# Patient Record
Sex: Female | Born: 1995 | Race: White | Hispanic: No | Marital: Single | Smoking: Former smoker
Health system: Southern US, Community
[De-identification: ages and names within clinical notes are randomized; demographics above are authoritative.]

## PROBLEM LIST (undated history)

## (undated) ENCOUNTER — Inpatient Hospital Stay: Payer: Self-pay

## (undated) DIAGNOSIS — R1083 Colic: Secondary | ICD-10-CM

## (undated) DIAGNOSIS — N912 Amenorrhea, unspecified: Secondary | ICD-10-CM

## (undated) DIAGNOSIS — F1910 Other psychoactive substance abuse, uncomplicated: Secondary | ICD-10-CM

## (undated) DIAGNOSIS — F419 Anxiety disorder, unspecified: Secondary | ICD-10-CM

## (undated) DIAGNOSIS — K219 Gastro-esophageal reflux disease without esophagitis: Secondary | ICD-10-CM

## (undated) DIAGNOSIS — J45909 Unspecified asthma, uncomplicated: Secondary | ICD-10-CM

## (undated) DIAGNOSIS — F32A Depression, unspecified: Secondary | ICD-10-CM

## (undated) DIAGNOSIS — R109 Unspecified abdominal pain: Secondary | ICD-10-CM

## (undated) HISTORY — PX: CHOLECYSTECTOMY: SHX55

## (undated) HISTORY — DX: Colic: R10.83

## (undated) HISTORY — PX: RHINOPLASTY: SUR1284

## (undated) HISTORY — DX: Unspecified asthma, uncomplicated: J45.909

## (undated) HISTORY — PX: SEPTOPLASTY: SUR1290

## (undated) HISTORY — DX: Amenorrhea, unspecified: N91.2

## (undated) HISTORY — DX: Unspecified abdominal pain: R10.9

---
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
## 2013-02-24 ENCOUNTER — Encounter: Payer: Self-pay | Admitting: Obstetrics and Gynecology

## 2013-02-26 ENCOUNTER — Encounter: Payer: Self-pay | Admitting: Obstetrics and Gynecology

## 2013-06-25 ENCOUNTER — Observation Stay: Payer: Self-pay

## 2013-06-25 LAB — URINALYSIS, COMPLETE
Bilirubin,UR: NEGATIVE
Blood: NEGATIVE
GLUCOSE, UR: NEGATIVE mg/dL (ref 0–75)
Ketone: NEGATIVE
NITRITE: NEGATIVE
PROTEIN: NEGATIVE
Ph: 7 (ref 4.5–8.0)
Specific Gravity: 1.011 (ref 1.003–1.030)
WBC UR: 1 /HPF (ref 0–5)

## 2013-08-23 ENCOUNTER — Observation Stay: Payer: Self-pay

## 2013-08-29 ENCOUNTER — Inpatient Hospital Stay: Payer: Self-pay

## 2013-08-29 LAB — CBC WITH DIFFERENTIAL/PLATELET
BASOS PCT: 0.4 %
Basophil #: 0 10*3/uL (ref 0.0–0.1)
EOS PCT: 0.2 %
Eosinophil #: 0 10*3/uL (ref 0.0–0.7)
HCT: 36.6 % (ref 35.0–47.0)
HGB: 12.1 g/dL (ref 12.0–16.0)
Lymphocyte #: 0.7 10*3/uL — ABNORMAL LOW (ref 1.0–3.6)
Lymphocyte %: 7.1 %
MCH: 30.3 pg (ref 26.0–34.0)
MCHC: 33.1 g/dL (ref 32.0–36.0)
MCV: 92 fL (ref 80–100)
Monocyte #: 0.7 x10 3/mm (ref 0.2–0.9)
Monocyte %: 7 %
NEUTROS PCT: 85.3 %
Neutrophil #: 8.7 10*3/uL — ABNORMAL HIGH (ref 1.4–6.5)
PLATELETS: 233 10*3/uL (ref 150–440)
RBC: 3.99 10*6/uL (ref 3.80–5.20)
RDW: 13.2 % (ref 11.5–14.5)
WBC: 10.2 10*3/uL (ref 3.6–11.0)

## 2013-08-29 LAB — GC/CHLAMYDIA PROBE AMP

## 2013-08-31 LAB — HEMATOCRIT: HCT: 33 % — AB (ref 35.0–47.0)

## 2014-03-08 ENCOUNTER — Emergency Department: Payer: Self-pay | Admitting: Emergency Medicine

## 2014-07-02 ENCOUNTER — Emergency Department: Payer: Self-pay | Admitting: Emergency Medicine

## 2014-07-02 LAB — URINALYSIS, COMPLETE
BLOOD: NEGATIVE
Bacteria: NONE SEEN
Bilirubin,UR: NEGATIVE
GLUCOSE, UR: NEGATIVE mg/dL (ref 0–75)
KETONE: NEGATIVE
Leukocyte Esterase: NEGATIVE
NITRITE: NEGATIVE
PH: 7 (ref 4.5–8.0)
PROTEIN: NEGATIVE
RBC,UR: NONE SEEN /HPF (ref 0–5)
SPECIFIC GRAVITY: 1.011 (ref 1.003–1.030)
Squamous Epithelial: 3

## 2014-07-02 LAB — TROPONIN I

## 2014-07-02 LAB — COMPREHENSIVE METABOLIC PANEL
Albumin: 3.1 g/dL — ABNORMAL LOW (ref 3.4–5.0)
Alkaline Phosphatase: 69 U/L (ref 46–116)
Anion Gap: 5 — ABNORMAL LOW (ref 7–16)
BILIRUBIN TOTAL: 0.3 mg/dL (ref 0.2–1.0)
BUN: 4 mg/dL — AB (ref 7–18)
CHLORIDE: 108 mmol/L — AB (ref 98–107)
CREATININE: 0.71 mg/dL (ref 0.60–1.30)
Calcium, Total: 8.8 mg/dL (ref 8.5–10.1)
Co2: 29 mmol/L (ref 21–32)
EGFR (African American): >60
EGFR (Non-African Amer.): >60
Glucose: 72 mg/dL (ref 65–99)
Osmolality: 279 (ref 275–301)
POTASSIUM: 4.1 mmol/L (ref 3.5–5.1)
SGOT(AST): 22 U/L (ref 15–37)
SGPT (ALT): 16 U/L (ref 14–63)
Sodium: 142 mmol/L (ref 136–145)
TOTAL PROTEIN: 7.1 g/dL (ref 6.4–8.2)

## 2014-07-02 LAB — CBC WITH DIFFERENTIAL/PLATELET
BASOS ABS: 0 10*3/uL (ref 0.0–0.1)
Basophil %: 0.8 %
Eosinophil #: 0.1 10*3/uL (ref 0.0–0.7)
Eosinophil %: 2.5 %
HCT: 37.2 % (ref 35.0–47.0)
HGB: 12.1 g/dL (ref 12.0–16.0)
LYMPHS ABS: 0.9 10*3/uL — AB (ref 1.0–3.6)
Lymphocyte %: 16.2 %
MCH: 30.2 pg (ref 26.0–34.0)
MCHC: 32.6 g/dL (ref 32.0–36.0)
MCV: 93 fL (ref 80–100)
MONO ABS: 0.5 x10 3/mm (ref 0.2–0.9)
Monocyte %: 8.5 %
NEUTROS ABS: 4 10*3/uL (ref 1.4–6.5)
Neutrophil %: 72 %
PLATELETS: 350 10*3/uL (ref 150–440)
RBC: 4.02 10*6/uL (ref 3.80–5.20)
RDW: 13.5 % (ref 11.5–14.5)
WBC: 5.6 10*3/uL (ref 3.6–11.0)

## 2014-07-02 LAB — LIPASE, BLOOD: LIPASE: 82 U/L (ref 73–393)

## 2014-07-02 LAB — AMYLASE: Amylase: 47 U/L (ref 25–115)

## 2014-07-03 DIAGNOSIS — F419 Anxiety disorder, unspecified: Secondary | ICD-10-CM | POA: Insufficient documentation

## 2014-07-03 DIAGNOSIS — F329 Major depressive disorder, single episode, unspecified: Secondary | ICD-10-CM | POA: Insufficient documentation

## 2014-07-03 DIAGNOSIS — F32A Depression, unspecified: Secondary | ICD-10-CM | POA: Insufficient documentation

## 2014-07-07 ENCOUNTER — Ambulatory Visit: Payer: Self-pay | Admitting: Family Medicine

## 2014-08-26 ENCOUNTER — Ambulatory Visit: Admit: 2014-08-26 | Disposition: A | Discharge status: Home / Self Care | Payer: Self-pay | Admitting: Surgery

## 2014-08-26 LAB — COMPREHENSIVE METABOLIC PANEL
ALBUMIN: 3.9 g/dL
ALK PHOS: 43 U/L
ALT: 15 U/L
AST: 18 U/L
Anion Gap: 4 — ABNORMAL LOW (ref 7–16)
BUN: 12 mg/dL
Bilirubin,Total: 0.5 mg/dL
CALCIUM: 8.7 mg/dL — AB
CHLORIDE: 109 mmol/L
CO2: 25 mmol/L
Creatinine: 0.72 mg/dL
EGFR (African American): >60
EGFR (Non-African Amer.): >60
Glucose: 93 mg/dL
POTASSIUM: 3.9 mmol/L
Sodium: 138 mmol/L
Total Protein: 6.8 g/dL

## 2014-08-26 LAB — CBC WITH DIFFERENTIAL/PLATELET
BASOS ABS: 0 10*3/uL (ref 0.0–0.1)
BASOS PCT: 0.5 %
EOS ABS: 0.1 10*3/uL (ref 0.0–0.7)
EOS PCT: 1 %
HCT: 39.3 % (ref 35.0–47.0)
HGB: 13.1 g/dL (ref 12.0–16.0)
LYMPHS ABS: 1.1 10*3/uL (ref 1.0–3.6)
Lymphocyte %: 16.6 %
MCH: 29.9 pg (ref 26.0–34.0)
MCHC: 33.3 g/dL (ref 32.0–36.0)
MCV: 90 fL (ref 80–100)
MONO ABS: 0.5 x10 3/mm (ref 0.2–0.9)
Monocyte %: 7.1 %
Neutrophil #: 4.9 10*3/uL (ref 1.4–6.5)
Neutrophil %: 74.8 %
PLATELETS: 268 10*3/uL (ref 150–440)
RBC: 4.38 10*6/uL (ref 3.80–5.20)
RDW: 14.5 % (ref 11.5–14.5)
WBC: 6.6 10*3/uL (ref 3.6–11.0)

## 2014-09-01 ENCOUNTER — Ambulatory Visit
Admit: 2014-09-01 | Disposition: A | Discharge status: Home / Self Care | Payer: Self-pay | Attending: Surgery | Admitting: Surgery

## 2014-09-21 LAB — SURGICAL PATHOLOGY

## 2014-09-27 NOTE — Op Note (Signed)
PATIENT NAME:  Amanda Barnett, Amanda Barnett MR#:  [id number] DATE OF BIRTH:  1995/09/18  DATE OF PROCEDURE:  09/01/2014  PREOPERATIVE DIAGNOSIS: Chronic acalculous cholecystitis.   POSTOPERATIVE DIAGNOSIS: Chronic acalculous cholecystitis.   PROCEDURE: Laparoscopic cholecystectomy.   SURGEON: Renda RollsWilton Smith, MD  ANESTHESIA: General.   INDICATIONS: This 19 year old female has a 3 month history of intermittent right upper quadrant abdominal pains. She had an abnormally low gallbladder ejection fraction at 27% and surgery was recommended for definitive treatment.   DESCRIPTION OF PROCEDURE: The patient was placed on the operating table in the supine position under general endotracheal anesthesia. The abdomen was prepared with ChloraPrep and draped in a sterile manner.   A short incision was made in the inferior aspect of the umbilicus and carried down to the deep fascia which was grasped with laryngeal hook and elevated. A Veress needle was inserted, aspirated, and irrigated with a saline solution. Next, the peritoneal cavity was inflated with carbon dioxide. The Veress needle was removed. The 10 mm cannula was inserted. The 10 mm, 0 degree laparoscope was inserted to view the peritoneal cavity. Another incision was made in the epigastrium slightly to the right of the midline to introduce an 11 mm cannula. Two incisions were made in the lateral aspect of the right upper quadrant to introduce two 5 mm cannulas. Initial inspection revealed there was some mild distention of the stomach, and I had the anesthetist insert an orogastric tube for decompression of the stomach. The liver appeared normal. The gallbladder appeared typical. Visible intestines appeared typical. The gallbladder was retracted towards the right shoulder. The infundibulum was retracted inferiorly and laterally. The porta hepatis was noted. The gallbladder neck was mobilized with incision of the visceral peritoneum. The cystic artery was dissected  free from surrounding structures. The cystic duct was dissected. It appeared that the cystic artery was in the way of dissection of cystic duct and therefore the cystic artery was controlled with Endoclips and divided. This allowed better exposure of the cystic duct which was further dissected free from surrounding structures. An Endoclip was placed across the cystic duct adjacent to the neck of the gallbladder. An incision was made in the cystic duct to introduce a Reddick catheter; however, the catheter would only thread in approximately 6 mm. It would not thread far enough in to inflate the balloon and therefore cholangiogram was not done. The Reddick catheter was removed. The cystic duct was doubly ligated with Endoclips and divided. The gallbladder was dissected free from the liver with hook and cautery. Bleeding was very minimal. Hemostasis was subsequently intact. The gallbladder was delivered up through the infraumbilical incision, opened and suctioned, removed and submitted in formalin for routine pathology. The right upper quadrant was further inspected and could see hemostasis was intact. The cannulas were removed allowing carbon dioxide to escape from the peritoneal cavity. Skin incisions were closed with interrupted 5-0 chromic subcuticular suture, benzoin, and Steri-Strips. Dressings were applied with paper tape.   The patient appeared to tolerate the procedure satisfactorily and was then prepared for transfer to the recovery room.  ____________________________ Shela CommonsJ. Renda RollsWilton Smith, MD jws:sb D: 09/01/2014 08:57:22 ET T: 09/01/2014 09:14:19 ET JOB#: [id number]  cc: Adella HareJ. Wilton Smith, MD, <Dictator> Adella HareWILTON J SMITH MD ELECTRONICALLY SIGNED 09/02/2014 16:27

## 2014-10-06 NOTE — H&P (Signed)
L&D Evaluation:  History:  HPI 19yo SWF present in early labor at 2322w5d G1P0000. Reports labor since 3am worse from 10am on- denies decreased FM or LOF. Normal PNC except + CMZ in second trimester-TOC- & 36w labs negative.GBS+, h/o anxiety & depression- on zoloft.   Presents with contractions   Patient's Medical History anxiety/depression,   Patient's Surgical History none   Medications Pre Natal Vitamins  Tylenol (Acetaminophen)  zoloft, acyclovir, omeprazole   Allergies other, latex   Social History none   Family History Non-Contributory   ROS:  ROS All systems were reviewed.  HEENT, CNS, GI, GU, Respiratory, CV, Renal and Musculoskeletal systems were found to be normal.   Exam:  Vital Signs stable   General no apparent distress   Mental Status clear   Chest clear   Heart normal sinus rhythm   Abdomen gravid, tender with contractions   Estimated Fetal Weight Average for gestational age   Fetal Position vtx   Edema 1+   Mebranes Intact   FHT normal rate with no decels   Fetal Heart Rate 125   Ucx regular   Ucx Frequency 2 min   Length of each Contraction 60 seconds   Ucx Pain Scale 7   Skin dry   Impression:  Impression early labor   Plan:  Plan monitor contractions and for cervical change, antibiotics for GBBS prophylaxis   Electronic Signatures: Amanda Barnett, Amanda Barnett (CNM)  (Signed 03-Apr-15 15:39)  Authored: L&D Evaluation   Last Updated: 03-Apr-15 15:39 by Amanda Barnett, Amanda Barnett (CNM)

## 2015-01-01 ENCOUNTER — Encounter: Payer: Self-pay | Admitting: *Deleted

## 2015-01-05 NOTE — Discharge Instructions (Signed)
[facility] °[facility] °ENDOSCOPIC SINUS SURGERY ° [facility] ° °What is Functional Endoscopic Sinus Surgery? ° The Surgery involves making the natural openings of the sinuses larger by removing the bony partitions that separate the sinuses from the nasal cavity.  The natural sinus lining is preserved as much as possible to allow the sinuses to resume normal function after the surgery.  In some patients nasal polyps (excessively swollen lining of the sinuses) may be removed to relieve obstruction of the sinus openings.  The surgery is performed through the nose using lighted scopes, which eliminates the need for incisions on the face.  A septoplasty is a different procedure which is sometimes performed with sinus surgery.  It involves straightening the boy partition that separates the two sides of your nose.  A crooked or deviated septum may need repair if is obstructing the sinuses or nasal airflow.  Turbinate reduction is also often performed during sinus surgery.  The turbinates are bony proturberances from the side walls of the nose which swell and can obstruct the nose in patients with sinus and allergy problems.  Their size can be surgically reduced to help relieve nasal obstruction. ° °What Can Sinus Surgery Do For Me? ° Sinus surgery can reduce the frequency of sinus infections requiring antibiotic treatment.  This can provide improvement in nasal congestion, post-nasal drainage, facial pressure and nasal obstruction.  Surgery will NOT prevent you from ever having an infection again, so it usually only for patients who get infections 4 or more times yearly requiring antibiotics, or for infections that do not clear with antibiotics.  It will not cure nasal allergies, so patients with allergies may still require medication to treat their allergies after surgery. Surgery may improve headaches related to sinusitis, however, some people will continue to  require medication to control sinus headaches related to allergies.  Surgery will do nothing for other forms of headache (migraine, tension or cluster). °What Are the Risks of Endoscopic Sinus Surgery? ° Current techniques allow surgery to be performed safely with little risk, however, there are rare complications that patients should be aware of.  Because the sinuses are located around the eyes, there is risk of eye injury, including blindness, though again, this would be quite rare. This is usually a result of bleeding behind the eye during surgery, which puts the vision oat risk, though there are treatments to protect the vision and prevent permanent disrupted by surgery causing a leak of the spinal fluid that surrounds the brain.  More serious complications would include bleeding inside the brain cavity or damage to the brain.  Again, all of these complications are uncommon, and spinal fluid leaks can be safely managed surgically if they occur.  The most common complication of sinus surgery is bleeding from the nose, which may require packing or cauterization of the nose.  Continued sinus have polyps may experience recurrence of the polyps requiring revision surgery.  Alterations of sense of smell or injury to the tear ducts are also rare complications.  °What is the Surgery Like, and what is the Recovery? ° The Surgery usually takes a couple of hours to perform, and is usually performed under a general anesthetic (completely asleep).  Patients are usually discharged home after a couple of hours.  Sometimes during surgery it is necessary to pack the nose to control bleeding, and the packing is left in place for 24 - 48 hours, and removed by your surgeon.  If   a septoplasty was performed during the procedure, there is often a splint placed which must be removed after 5-7 days.   °Discomfort: Pain is usually mild to moderate, and can be controlled by prescription pain medication or acetaminophen (Tylenol).   Aspirin, Ibuprofen (Advil, Motrin), or Naprosyn (Aleve) should be avoided, as they can cause increased bleeding.  Most patients feel sinus pressure like they have a bad head cold for several days.  Sleeping with your head elevated can help reduce swelling and facial pressure, as can ice packs over the face.  A humidifier may be helpful to keep the mucous and blood from drying in the nose.  °Diet: There are no specific diet restrictions, however, you should generally start with clear liquids and a light diet of bland foods because the anesthetic can cause some nausea.  Advance your diet depending on how your stomach feels.  Taking your pain medication with food will often help reduce stomach upset which pain medications can cause. ° °Nasal Saline Irrigation: It is important to remove blood clots and dried mucous from the nose as it is healing.  This is done by having you irrigate the nose at least 3 - 4 times daily with a salt water solution.  The salt water solution is made as follows:  1) 2 - 3 heaping teaspoons of canning, pickling or sea salt °  2) 1 teaspoon baking soda, such as Arm [employer] Hammer °  3) 1 quart of warm distilled water °The nose is irrigated using an ear syringe (available at the drug store).  Fill the bulb with the solution, bend over a sink, and insert the syringe into the nose ½ to ¾ of an inch.  Point the tip of the syringe towards the inside corner of the eye on the same side your irrigating.  Squeeze the syringe and gently irrigate the nose.  If you bend forward as you do this, most of the fluid will flow back out of the nose, instead of down your throat.  Make a new solution every 2 - 3 days.  The solution should be ward, near body temperature, when you irrigate. ° °Note that if you are instructed to use Nasal Steroid Sprays at any time after your surgery, irrigate with saline BEFORE using the steroid spray, so you do not wash it all out of the nose. °Another product, Nasal Saline Gel (such as  AYR Nasal Saline Gel) can be applied in each nostril 3 - 4 times daily to moisture the nose and reduce scabbing or crusting. ° °Bleeding:  Bloody drainage from the nose can be expected for several days, and patients are instructed to irrigate their nose frequently with salt water to help remove mucous and blood clots.  The drainage may be dark red or brown, though some fresh blood may be seen intermittently, especially after irrigation.  Do not blow you nose, as bleeding may occur. If you must sneeze, keep your mouth open to allow air to escape through your mouth. °If heavy bleeding occurs: Irrigate the nose with saline to rinse out clots, then spray the nose 3 - 4 times with Afrin Nasal Decongestant Spray.  The spray will constrict the blood vessels to slow bleeding.  Pinch the lower half of your nose shut to apply pressure, and lay down with your head elevated.  Ice packs over the nose may help as well. If bleeding persists despite these measures, you should notify your doctor.  Do not use the Afrin routinely   to control nasal congestion after surgery, as it can result in worsening congestion and may affect healing.  ° °Activity: Return to work varies among patients. Most patients will be out of work at least 5 - 7 days to recover.  Patient may return to work after they are off of narcotic pain medication, and feeling well enough to perform the functions of their job.  Patients must avoid heavy lifting (over 10 pounds) or strenuous physical for 2 weeks after surgery, so your employer may need to assign you to light duty, or keep you out of work longer if light duty is not possible.  NOTE: you should not drive, operate dangerous machinery, do any mentally demanding tasks or make any important legal or financial decisions while on narcotic pain medication and recovering from the general anesthetic.  °  °Call Your Doctor Immediately if You Have Any of the Following: °1. Bleeding that you cannot control with the above  measures °2. Loss of vision, double vision, bulging of the eye or black eyes. °3. Fever over 101 degrees °4. Neck stiffness with severe headache, fever, nausea and change in mental state. °You are always encourage to call anytime with concerns, however, please call with requests for pain medication refills during office hours. ° °Office Endoscopy: During follow-up visits your doctor will remove any packing or splints that may have been placed and evaluate and clean your sinuses endoscopically.  Topical anesthetic will be used to make this as comfortable as possible, though you may want to take your pain medication prior to the visit.  How often this will need to be done varies from patient to patient.  After complete recovery from the surgery, you may need follow-up endoscopy from time to time, particularly if there is concern of recurrent infection or nasal polyps. ° ° °General Anesthesia, Care After °Refer to this sheet in the next few weeks. These instructions provide you with information on caring for yourself after your procedure. Your health care provider may also give you more specific instructions. Your treatment has been planned according to current medical practices, but problems sometimes occur. Call your health care provider if you have any problems or questions after your procedure. °WHAT TO EXPECT AFTER THE PROCEDURE °After the procedure, it is typical to experience: °· Sleepiness. °· Nausea and vomiting. °HOME CARE INSTRUCTIONS °· For the first 24 hours after general anesthesia: °¨ Have a responsible person with you. °¨ Do not drive a car. If you are alone, do not take public transportation. °¨ Do not drink alcohol. °¨ Do not take medicine that has not been prescribed by your health care provider. °¨ Do not sign important papers or make important decisions. °¨ You may resume a normal diet and activities as directed by your health care provider. °· Change bandages (dressings) as directed. °· If you  have questions or problems that seem related to general anesthesia, call the hospital and ask for the anesthetist or anesthesiologist on call. °SEEK MEDICAL CARE IF: °· You have nausea and vomiting that continue the day after anesthesia. °· You develop a rash. °SEEK IMMEDIATE MEDICAL CARE IF:  °· You have difficulty breathing. °· You have chest pain. °· You have any allergic problems. °Document Released: 08/21/2000 Document Revised: 05/20/2013 Document Reviewed: 11/28/2012 °[employer]® Patient Information ©2015 [employer]. This information is not intended to replace advice given to you by your health care provider. Make sure you discuss any questions you have with your health care provider. ° °

## 2015-01-07 ENCOUNTER — Ambulatory Visit
Admission: RE | Admit: 2015-01-07 | Discharge: 2015-01-07 | Disposition: A | Discharge status: Home / Self Care | Payer: Medicaid Other | Source: Ambulatory Visit | Attending: Otolaryngology | Admitting: Otolaryngology

## 2015-01-07 ENCOUNTER — Ambulatory Visit: Payer: Medicaid Other | Admitting: Anesthesiology

## 2015-01-07 ENCOUNTER — Encounter
Admission: RE | Disposition: A | Discharge status: Home / Self Care | Payer: Self-pay | Source: Ambulatory Visit | Attending: Otolaryngology

## 2015-01-07 DIAGNOSIS — F17210 Nicotine dependence, cigarettes, uncomplicated: Secondary | ICD-10-CM | POA: Diagnosis not present

## 2015-01-07 DIAGNOSIS — K219 Gastro-esophageal reflux disease without esophagitis: Secondary | ICD-10-CM | POA: Diagnosis not present

## 2015-01-07 DIAGNOSIS — J988 Other specified respiratory disorders: Secondary | ICD-10-CM | POA: Insufficient documentation

## 2015-01-07 DIAGNOSIS — J342 Deviated nasal septum: Secondary | ICD-10-CM | POA: Diagnosis present

## 2015-01-07 DIAGNOSIS — Z8379 Family history of other diseases of the digestive system: Secondary | ICD-10-CM | POA: Diagnosis not present

## 2015-01-07 HISTORY — DX: Gastro-esophageal reflux disease without esophagitis: K21.9

## 2015-01-07 HISTORY — PX: RHINOPLASTY: SHX2354

## 2015-01-07 HISTORY — PX: SEPTOPLASTY: SHX2393

## 2015-01-07 SURGERY — SEPTOPLASTY, NOSE
Anesthesia: General | Wound class: Clean Contaminated

## 2015-01-07 MED ORDER — OXYCODONE HCL 5 MG PO TABS
5.0000 mg | ORAL_TABLET | Freq: Once | ORAL | Status: AC | PRN
  Administered 2015-01-07: 5 mg via ORAL

## 2015-01-07 MED ORDER — DEXTROSE 5 % IV SOLN: 2000.0000 mg | Freq: Once | INTRAVENOUS | Status: DC

## 2015-01-07 MED ORDER — ONDANSETRON HCL 4 MG/2ML IJ SOLN: 4.0000 mg | Freq: Once | INTRAMUSCULAR | Status: DC | PRN

## 2015-01-07 MED ORDER — ACETAMINOPHEN 10 MG/ML IV SOLN
1000.0000 mg | Freq: Once | INTRAVENOUS | Status: AC
  Administered 2015-01-07: 1000 mg via INTRAVENOUS

## 2015-01-07 MED ORDER — OXYCODONE HCL 5 MG/5ML PO SOLN: 5.0000 mg | Freq: Once | ORAL | Status: AC | PRN

## 2015-01-07 MED ORDER — DEXAMETHASONE SODIUM PHOSPHATE 4 MG/ML IJ SOLN
INTRAMUSCULAR | Status: DC | PRN
  Administered 2015-01-07: 10 mg via INTRAVENOUS

## 2015-01-07 MED ORDER — ROCURONIUM BROMIDE 100 MG/10ML IV SOLN
INTRAVENOUS | Status: DC | PRN
  Administered 2015-01-07: 20 mg via INTRAVENOUS

## 2015-01-07 MED ORDER — PROPOFOL 10 MG/ML IV BOLUS
INTRAVENOUS | Status: DC | PRN
Start: 2015-01-07 — End: 2015-01-07
  Administered 2015-01-07: 150 mg via INTRAVENOUS

## 2015-01-07 MED ORDER — MIDAZOLAM HCL 5 MG/5ML IJ SOLN
INTRAMUSCULAR | Status: DC | PRN
  Administered 2015-01-07: 2 mg via INTRAVENOUS

## 2015-01-07 MED ORDER — FENTANYL CITRATE (PF) 100 MCG/2ML IJ SOLN
INTRAMUSCULAR | Status: DC | PRN
Start: 2015-01-07 — End: 2015-01-07
  Administered 2015-01-07: 25 ug via INTRAVENOUS
  Administered 2015-01-07: 50 ug via INTRAVENOUS
  Administered 2015-01-07: 100 ug via INTRAVENOUS
  Administered 2015-01-07: 25 ug via INTRAVENOUS
  Administered 2015-01-07: 50 ug via INTRAVENOUS
  Administered 2015-01-07 (×2): 25 ug via INTRAVENOUS

## 2015-01-07 MED ORDER — PHENYLEPHRINE HCL 0.5 % NA SOLN
NASAL | Status: DC | PRN
  Administered 2015-01-07: 30 mL via TOPICAL

## 2015-01-07 MED ORDER — OXYMETAZOLINE HCL 0.05 % NA SOLN
2.0000 | Freq: Once | NASAL | Status: AC
  Administered 2015-01-07: 2 via NASAL

## 2015-01-07 MED ORDER — LIDOCAINE-EPINEPHRINE 1 %-1:100000 IJ SOLN
INTRAMUSCULAR | Status: DC | PRN
  Administered 2015-01-07: 9 mL

## 2015-01-07 MED ORDER — GLYCOPYRROLATE 0.2 MG/ML IJ SOLN
INTRAMUSCULAR | Status: DC | PRN
  Administered 2015-01-07: .1 mg via INTRAVENOUS

## 2015-01-07 MED ORDER — LIDOCAINE HCL (CARDIAC) 20 MG/ML IV SOLN
INTRAVENOUS | Status: DC | PRN
  Administered 2015-01-07: 50 mg via INTRAVENOUS

## 2015-01-07 MED ORDER — HYDROMORPHONE HCL 1 MG/ML IJ SOLN
0.2500 mg | INTRAMUSCULAR | Status: DC | PRN
  Administered 2015-01-07 (×3): 0.4 mg via INTRAVENOUS
  Administered 2015-01-07: 0.2 mg via INTRAVENOUS
  Administered 2015-01-07: 0.4 mg via INTRAVENOUS

## 2015-01-07 MED ORDER — CEFAZOLIN SODIUM 1-5 GM-% IV SOLN
INTRAVENOUS | Status: DC | PRN
  Administered 2015-01-07: 1 g via INTRAVENOUS

## 2015-01-07 MED ORDER — LACTATED RINGERS IV SOLN
INTRAVENOUS | Status: DC
Start: 2015-01-07 — End: 2015-01-07
  Administered 2015-01-07: 12:00:00 via INTRAVENOUS

## 2015-01-07 SURGICAL SUPPLY — 30 items
AQUAPLAST 3X3 FLAT (MISCELLANEOUS) ×3
BLADE SURG 15 STRL LF DISP TIS (BLADE) IMPLANT
BLADE SURG 15 STRL SS (BLADE)
CANISTER SUCT 1200ML W/VALVE (MISCELLANEOUS) ×3 IMPLANT
COAG SUCT 10F 3.5MM HAND CTRL (MISCELLANEOUS) ×3 IMPLANT
DRAPE HEAD BAR (DRAPES) ×3 IMPLANT
DRESSING NASL FOAM PST OP SINU (MISCELLANEOUS) IMPLANT
DRSG NASAL FOAM POST OP SINU (MISCELLANEOUS)
GLOVE PI ULTRA LF STRL 7.5 (GLOVE) ×2 IMPLANT
GLOVE PI ULTRA NON LATEX 7.5 (GLOVE) ×4
NEEDLE HYPO 25GX1X1/2 BEV (NEEDLE) ×3 IMPLANT
NS IRRIG 500ML POUR BTL (IV SOLUTION) ×3 IMPLANT
PACK DRAPE NASAL/ENT (PACKS) ×3 IMPLANT
PACKING NASAL EPIS 4X2.4 XEROG (MISCELLANEOUS) IMPLANT
PAD GROUND ADULT SPLIT (MISCELLANEOUS) ×3 IMPLANT
PATTIES SURGICAL .5 X3 (DISPOSABLE) ×3 IMPLANT
SPLINT AQUAPLAST 3X3 FLAT (MISCELLANEOUS) ×1 IMPLANT
SPLINT NASAL SEPTAL BLV .50 ST (MISCELLANEOUS) ×3 IMPLANT
STRAP BODY AND KNEE 60X3 (MISCELLANEOUS) ×3 IMPLANT
SUT CHROMIC 3-0 (SUTURE) ×2
SUT CHROMIC 3-0 KS 27XMFL CR (SUTURE) ×1
SUT ETHILON 3-0 KS 30 BLK (SUTURE) ×3 IMPLANT
SUT ETHILON 4-0 (SUTURE)
SUT ETHILON 4-0 FS2 18XMFL BLK (SUTURE)
SUT PLAIN GUT 4-0 (SUTURE) ×3 IMPLANT
SUTURE CHRMC 3-0 KS 27XMFL CR (SUTURE) ×1 IMPLANT
SUTURE ETHLN 4-0 FS2 18XMF BLK (SUTURE) IMPLANT
SYR 3ML LL SCALE MARK (SYRINGE) ×3 IMPLANT
TOWEL OR 17X26 4PK STRL BLUE (TOWEL DISPOSABLE) ×3 IMPLANT
WATER STERILE IRR 500ML POUR (IV SOLUTION) IMPLANT

## 2015-01-07 NOTE — Op Note (Signed)
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 The the caudal end of the septal cartilage was buckled on itself and turned sideways blocking the upper nasal valves on each side. Nasal bones were deviated way off left side pulling the ethmoid plate to the left and angling it sharply so it obstructed the right posterior airway  Procedure: The patient was given general anesthesia by oral endotracheal intubation. Her nose prepped draped in sterile fashion. No showed evidence of septal deviation anteriorly. You could feel a very wide the superior, anterior caudal septal cartilage nose obstructing the upper nasal valves on both sides and blocking the nasal airways. The nasal dorsum was deviated way to the left side. The ethmoid plate was buckled to the left anteriorly and to the right posteriorly. A left hemitransfixion incision was created with elevation of mucoperichondrium on both sides of the very crooked anterior septal cartilage. This was very scarred down it took a long time get this elevated and lifted up on both sides. Mucoperiosteum was elevated over both sides of the ethmoid plate as well the inferior cartilage and vomer were already previously removed and the mucosa there was lying closer to the midline. The ethmoid plate was infractured slightly to allow it to rotate a little bit on the nasal bones. The anterior cartilage was morselized to help break the spring and flatten it out so with sit more the midline this was sutured with mucosa back to it with 3-0 chromic sutures to anchor it into place. This was used to close the hemitransfixion incision as well. This seemed to help open up the nasal  airways anteriorly the nose was still crooked to the left side.  Intercartilaginous incisions were created with elevation of the skin over the nasal dorsum. Curved guarded chisels were used to free up the nasal bones are midline. File was used to smooth off all the sharp edges. Tunnels were then created low laterally on the nasal bones on both sides. Artery was used along the anterior border of the inferior turbinate to start the frontal and this was brought up to the medial canthal area. Curved guarded chisels were then used on both sides to fracture the nasal bones with low lateral osteotomies. Her completely freed as well as the ethmoid plate with slide back towards the midline. The sat in a good position in the midline and the nasal tip appeared to be in the midline as well. The columella was straight in the airways appeared open on both sides. Xomed 0.5 mm nasal splints were then trimmed and placed on both sides the nasal septum and held in place with 3-0 nylon through and through suture Steri-Strips were placed over the nasal dorsum followed by an Aquaplast cast.  Dispo:   Patient was awakened taken to the recovery room in satisfactory condition. There were no operative complications. She is not currently bleeding. Ears good airflow through by the nose and the septum and nose was straight area  Plan:  Patient is to rest at home for the next 4 days with her head elevated.  she'll return to the office in 6 days for removal of splints and cast.. Keflex twice  a day for week. She'll use a prednisone taper from 30-0/6 days. She is given Norco 5/325 #30 pills to use for pain when necessary.  Amanda Barnett  01/07/2015 2:48 PM

## 2015-01-07 NOTE — H&P (Signed)
  H&P has been reviewed and no changes necessary. To be downloaded later. 

## 2015-01-07 NOTE — Transfer of Care (Signed)
Immediate Anesthesia Transfer of Care Note  Patient: Amanda Barnett  Procedure(s) Performed: Procedure(s) with comments: SEPTOPLASTY (N/A) - GAVE DISK TO CECE 8-10 KP RHINOPLASTY (N/A)  Patient Location: PACU  Anesthesia Type: General  Level of Consciousness: awake, alert  and patient cooperative  Airway and Oxygen Therapy: Patient Spontanous Breathing and Patient connected to supplemental oxygen  Post-op Assessment: Post-op Vital signs reviewed, Patient's Cardiovascular Status Stable, Respiratory Function Stable, Patent Airway and No signs of Nausea or vomiting  Post-op Vital Signs: Reviewed and stable  Complications: No apparent anesthesia complications

## 2015-01-07 NOTE — Anesthesia Procedure Notes (Signed)
Procedure Name: Intubation Date/Time: 01/07/2015 1:04 PM Performed by: Jimmy Picket Pre-anesthesia Checklist: Patient identified, Emergency Drugs available, Suction available, Patient being monitored and Timeout performed Patient Re-evaluated:Patient Re-evaluated prior to inductionOxygen Delivery Method: Circle system utilized Preoxygenation: Pre-oxygenation with 100% oxygen Intubation Type: IV induction Ventilation: Mask ventilation without difficulty Grade View: Grade I Tube type: Oral Rae Tube size: 7.0 mm Number of attempts: 1 Placement Confirmation: ETT inserted through vocal cords under direct vision,  positive ETCO2 and breath sounds checked- equal and bilateral Tube secured with: Tape Dental Injury: Teeth and Oropharynx as per pre-operative assessment

## 2015-01-07 NOTE — Anesthesia Postprocedure Evaluation (Signed)
  Anesthesia Post-op Note  Patient: Amanda Barnett  Procedure(s) Performed: Procedure(s) with comments: SEPTOPLASTY (N/A) - GAVE DISK TO CECE 8-10 KP RHINOPLASTY (N/A)  Anesthesia type:General  Patient location: PACU  Post pain: Pain level controlled  Post assessment: Post-op Vital signs reviewed, Patient's Cardiovascular Status Stable, Respiratory Function Stable, Patent Airway and No signs of Nausea or vomiting  Post vital signs: Reviewed and stable  Last Vitals:  Filed Vitals:   01/07/15 1510  BP:   Pulse: 73  Temp:   Resp: 12    Level of consciousness: awake, alert  and patient cooperative  Complications: No apparent anesthesia complications

## 2015-01-07 NOTE — Anesthesia Preprocedure Evaluation (Signed)
Anesthesia Evaluation  Patient identified by MRN, date of birth, ID band Patient awake    Reviewed: Allergy & Precautions, NPO status , Patient's Chart, lab work & pertinent test results  Airway Mallampati: II  TM Distance: >3 FB Neck ROM: Full    Dental   Pulmonary Current Smoker,    Pulmonary exam normal       Cardiovascular Normal cardiovascular exam    Neuro/Psych    GI/Hepatic GERD-  ,  Endo/Other    Renal/GU      Musculoskeletal   Abdominal   Peds  Hematology   Anesthesia Other Findings   Reproductive/Obstetrics                             Anesthesia Physical Anesthesia Plan  ASA: II  Anesthesia Plan: General   Post-op Pain Management:    Induction: Intravenous  Airway Management Planned: Oral ETT  Additional Equipment:   Intra-op Plan:   Post-operative Plan: Extubation in OR  Informed Consent: I have reviewed the patients History and Physical, chart, labs and discussed the procedure including the risks, benefits and alternatives for the proposed anesthesia with the patient or authorized representative who has indicated his/her understanding and acceptance.     Plan Discussed with: CRNA  Anesthesia Plan Comments:         Anesthesia Quick Evaluation  

## 2015-01-08 ENCOUNTER — Encounter: Payer: Self-pay | Admitting: Otolaryngology

## 2015-01-26 ENCOUNTER — Emergency Department
Admission: EM | Admit: 2015-01-26 | Discharge: 2015-01-26 | Disposition: A | Discharge status: Home / Self Care | Payer: Medicaid Other | Attending: Emergency Medicine | Admitting: Emergency Medicine

## 2015-01-26 ENCOUNTER — Encounter: Payer: Self-pay | Admitting: Emergency Medicine

## 2015-01-26 DIAGNOSIS — Z720 Tobacco use: Secondary | ICD-10-CM | POA: Insufficient documentation

## 2015-01-26 DIAGNOSIS — J029 Acute pharyngitis, unspecified: Secondary | ICD-10-CM | POA: Insufficient documentation

## 2015-01-26 DIAGNOSIS — Z79899 Other long term (current) drug therapy: Secondary | ICD-10-CM | POA: Diagnosis not present

## 2015-01-26 DIAGNOSIS — Z91040 Latex allergy status: Secondary | ICD-10-CM | POA: Insufficient documentation

## 2015-01-26 MED ORDER — PENICILLIN G BENZATHINE 1200000 UNIT/2ML IM SUSP
1.2000 10*6.[IU] | Freq: Once | INTRAMUSCULAR | Status: AC
  Administered 2015-01-26: 1.2 10*6.[IU] via INTRAMUSCULAR
  Filled 2015-01-26: qty 2

## 2015-01-26 MED ORDER — LIDOCAINE VISCOUS 2 % MT SOLN: 20.0000 mL | OROMUCOSAL | Status: DC | PRN

## 2015-01-26 MED ORDER — PENICILLIN G BENZATHINE & PROC 1200000 UNIT/2ML IM SUSP
1.2000 10*6.[IU] | Freq: Once | INTRAMUSCULAR | Status: DC
  Filled 2015-01-26: qty 2

## 2015-01-26 MED ORDER — LIDOCAINE VISCOUS 2 % MT SOLN
15.0000 mL | Freq: Once | OROMUCOSAL | Status: AC
  Administered 2015-01-26: 15 mL via OROMUCOSAL
  Filled 2015-01-26: qty 15

## 2015-01-26 NOTE — ED Notes (Signed)
Reports sore throat this am, states throat feels swollen. Pt very anxious, no resp distress

## 2015-01-26 NOTE — ED Provider Notes (Signed)
[facility] Emergency Department Provider Note  ____________________________________________  Time seen: Approximately 1:12 PM  I have reviewed the triage vital signs and the nursing notes.   HISTORY  Chief Complaint Sore Throat    HPI Amanda Barnett is a 19 y.o. female who presents for evaluation of sudden onset of sore throat yesterday progressively getting worse today. Patient very anxious but displays no respiratory distress at this time. Recently had rhinoplasty approximately 2 weeks ago. Currently on no antibiotics   Past Medical History  Diagnosis Date  . Abdominal pain, recurrent   . Infantile colic     resolved  . GERD (gastroesophageal reflux disease)     Patient Active Problem List   Diagnosis Date Noted  . Nausea & vomiting 04/07/2011  . Epigastric abdominal pain     Past Surgical History  Procedure Laterality Date  . Septoplasty    . Cholecystectomy    . Septoplasty N/A 01/07/2015    Procedure: SEPTOPLASTY;  Surgeon: Vernie Murders, MD;  Location: [facility];  Service: ENT;  Laterality: N/A;  GAVE DISK TO CECE 8-10 KP  . Rhinoplasty N/A 01/07/2015    Procedure: RHINOPLASTY;  Surgeon: Vernie Murders, MD;  Location: [facility];  Service: ENT;  Laterality: N/A;  . Rhinoplasty      Current Outpatient Rx  Name  Route  Sig  Dispense  Refill  . lidocaine (XYLOCAINE) 2 % solution   Mouth/Throat   Use as directed 20 mLs in the mouth or throat as needed for mouth pain.   100 mL   0   . omeprazole (PRILOSEC) 20 MG capsule   Oral   Take 20 mg by mouth daily.           Allergies Latex  Family History  Problem Relation Age of Onset  . Pancreatitis Mother   . GER disease Maternal Grandmother   . Cholelithiasis Maternal Grandmother     Social History Social History  Substance Use Topics  . Smoking status: Current Every Day Smoker -- 0.25 packs/day for 2 years  . Smokeless tobacco: Never Used  . Alcohol Use:  No    Review of Systems Constitutional: No fever/chills Eyes: No visual changes. ENT: Positive severe sore throat Cardiovascular: Denies chest pain. Respiratory: Denies shortness of breath. Gastrointestinal: No abdominal pain.  No nausea, no vomiting.  No diarrhea.  No constipation. Genitourinary: Negative for dysuria. Musculoskeletal: Negative for back pain. Skin: Negative for rash. Neurological: Negative for headaches, focal weakness or numbness.  10-point ROS otherwise negative.  ____________________________________________   PHYSICAL EXAM:  VITAL SIGNS: ED Triage Vitals  Enc Vitals Group     BP 01/26/15 1227 130/99 mmHg     Pulse Rate 01/26/15 1227 112     Resp 01/26/15 1227 28     Temp 01/26/15 1227 100 F (37.8 C)     Temp Source 01/26/15 1227 Oral     SpO2 01/26/15 1227 100 %     Weight 01/26/15 1227 160 lb (72.576 kg)     Height 01/26/15 1227  (1.549 m)     Head Cir --      Peak Flow --      Pain Score 01/26/15 1229 10     Pain Loc --      Pain Edu? --      Excl. in GC? --     Constitutional: Alert and oriented. Well appearing and in no acute distress. Eyes: Conjunctivae are normal. PERRL. EOMI. Head:  Atraumatic. Nose: No congestion/rhinnorhea. Mouth/Throat: Mucous membranes are moist.  Oropharynx very erythematous with tonsillar edema noted no exudate Neck: No stridor.   Cardiovascular: Normal rate, regular rhythm. Grossly normal heart sounds.  Good peripheral circulation. Respiratory: Normal respiratory effort.  No retractions. Lungs CTAB. Musculoskeletal: No lower extremity tenderness nor edema.  No joint effusions. Neurologic:  Normal speech and language. No gross focal neurologic deficits are appreciated. No gait instability. Skin:  Skin is warm, dry and intact. No rash noted. Psychiatric: Mood and affect are normal. Speech and behavior are normal.  ____________________________________________   LABS (all labs ordered are listed, but only  abnormal results are displayed)  Labs Reviewed - No data to display ____________________________________________  PROCEDURES  Procedure(s) performed: None  Critical Care performed: No  ____________________________________________   INITIAL IMPRESSION / ASSESSMENT AND PLAN / ED COURSE  Pertinent labs & imaging results that were available during my care of the patient were reviewed by me and considered in my medical decision making (see chart for details).  Acute tonsillitis. Rx of Bicillin L-A 1.2 million units IM given while in the ED. Viscous lidocaine given as a prescription. Patient follow-up with ENT or return to the ER with any worsening symptomology. ____________________________________________   FINAL CLINICAL IMPRESSION(S) / ED DIAGNOSES  Final diagnoses:  Acute pharyngitis, unspecified pharyngitis type      Evangeline Dakin, PA-C 01/26/15 1331  Sharman Cheek, MD 01/26/15 1447

## 2015-01-26 NOTE — Discharge Instructions (Signed)
Pharyngitis Pharyngitis is redness, pain, and swelling (inflammation) of your pharynx.  CAUSES  Pharyngitis is usually caused by infection. Most of the time, these infections are from viruses (viral) and are part of a cold. However, sometimes pharyngitis is caused by bacteria (bacterial). Pharyngitis can also be caused by allergies. Viral pharyngitis may be spread from person to person by coughing, sneezing, and personal items or utensils (cups, forks, spoons, toothbrushes). Bacterial pharyngitis may be spread from person to person by more intimate contact, such as kissing.  SIGNS AND SYMPTOMS  Symptoms of pharyngitis include:   Sore throat.   Tiredness (fatigue).   Low-grade fever.   Headache.  Joint pain and muscle aches.  Skin rashes.  Swollen lymph nodes.  Plaque-like film on throat or tonsils (often seen with bacterial pharyngitis). DIAGNOSIS  Your health care provider will ask you questions about your illness and your symptoms. Your medical history, along with a physical exam, is often all that is needed to diagnose pharyngitis. Sometimes, a rapid strep test is done. Other lab tests may also be done, depending on the suspected cause.  TREATMENT  Viral pharyngitis will usually get better in 3-4 days without the use of medicine. Bacterial pharyngitis is treated with medicines that kill germs (antibiotics).  HOME CARE INSTRUCTIONS   Drink enough water and fluids to keep your urine clear or pale yellow.   Only take over-the-counter or prescription medicines as directed by your health care provider:   If you are prescribed antibiotics, make sure you finish them even if you start to feel better.   Do not take aspirin.   Get lots of rest.   Gargle with 8 oz of salt water ( tsp of salt per 1 qt of water) as often as every 1-2 hours to soothe your throat.   Throat lozenges (if you are not at risk for choking) or sprays may be used to soothe your throat. SEEK MEDICAL  CARE IF:   You have large, tender lumps in your neck.  You have a rash.  You cough up green, yellow-brown, or bloody spit. SEEK IMMEDIATE MEDICAL CARE IF:   Your neck becomes stiff.  You drool or are unable to swallow liquids.  You vomit or are unable to keep medicines or liquids down.  You have severe pain that does not go away with the use of recommended medicines.  You have trouble breathing (not caused by a stuffy nose). MAKE SURE YOU:   Understand these instructions.  Will watch your condition.  Will get help right away if you are not doing well or get worse. Document Released: 05/15/2005 Document Revised: 03/05/2013 Document Reviewed: 01/20/2013 ExitCare Patient Information 2015 [employer]. This information is not intended to replace advice given to you by your health care provider. Make sure you discuss any questions you have with your health care provider. Strep Throat Strep throat is an infection of the throat caused by a bacteria named Streptococcus pyogenes. Your health care provider may call the infection streptococcal "tonsillitis" or "pharyngitis" depending on whether there are signs of inflammation in the tonsils or back of the throat. Strep throat is most common in children aged 5-15 years during the cold months of the year, but it can occur in people of any age during any season. This infection is spread from person to person (contagious) through coughing, sneezing, or other close contact. SIGNS AND SYMPTOMS   Fever or chills.  Painful, swollen, red tonsils or throat.  Pain or difficulty when   swallowing.  White or yellow spots on the tonsils or throat.  Swollen, tender lymph nodes or "glands" of the neck or under the jaw.  Red rash all over the body (rare). DIAGNOSIS  Many different infections can cause the same symptoms. A test must be done to confirm the diagnosis so the right treatment can be given. A "rapid strep test" can help your health care  provider make the diagnosis in a few minutes. If this test is not available, a light swab of the infected area can be used for a throat culture test. If a throat culture test is done, results are usually available in a day or two. TREATMENT  Strep throat is treated with antibiotic medicine. HOME CARE INSTRUCTIONS   Gargle with 1 tsp of salt in 1 cup of warm water, 3-4 times per day or as needed for comfort.  Family members who also have a sore throat or fever should be tested for strep throat and treated with antibiotics if they have the strep infection.  Make sure everyone in your household washes their hands well.  Do not share food, drinking cups, or personal items that could cause the infection to spread to others.  You may need to eat a soft food diet until your sore throat gets better.  Drink enough water and fluids to keep your urine clear or pale yellow. This will help prevent dehydration.  Get plenty of rest.  Stay home from school, day care, or work until you have been on antibiotics for 24 hours.  Take medicines only as directed by your health care provider.  Take your antibiotic medicine as directed by your health care provider. Finish it even if you start to feel better. SEEK MEDICAL CARE IF:   The glands in your neck continue to enlarge.  You develop a rash, cough, or earache.  You cough up green, yellow-brown, or bloody sputum.  You have pain or discomfort not controlled by medicines.  Your problems seem to be getting worse rather than better.  You have a fever. SEEK IMMEDIATE MEDICAL CARE IF:   You develop any new symptoms such as vomiting, severe headache, stiff or painful neck, chest pain, shortness of breath, or trouble swallowing.  You develop severe throat pain, drooling, or changes in your voice.  You develop swelling of the neck, or the skin on the neck becomes red and tender.  You develop signs of dehydration, such as fatigue, dry mouth, and  decreased urination.  You become increasingly sleepy, or you cannot wake up completely. MAKE SURE YOU:  Understand these instructions.  Will watch your condition.  Will get help right away if you are not doing well or get worse. Document Released: 05/12/2000 Document Revised: 09/29/2013 Document Reviewed: 07/14/2010 ExitCare Patient Information 2015 [employer]. This information is not intended to replace advice given to you by your health care provider. Make sure you discuss any questions you have with your health care provider.  

## 2015-07-08 ENCOUNTER — Telehealth: Payer: Self-pay | Admitting: Obstetrics and Gynecology

## 2015-07-08 NOTE — Telephone Encounter (Signed)
I don't see an opening either. Encourage her to hold out till Tues.

## 2015-07-08 NOTE — Telephone Encounter (Signed)
Pt called and states she is in a lot of pain and really wants to be seen today to get her IUD removed, I didn't see an opening but she had an opeing on Tuesday and the pt said that was to long, let me know if i need to put her on the schedule somewhere for today.

## 2015-07-08 NOTE — Telephone Encounter (Signed)
Mel do you want me to add her today??

## 2016-01-03 LAB — HM HIV SCREENING LAB: HM HIV Screening: NEGATIVE

## 2016-05-29 NOTE — L&D Delivery Note (Signed)
1705 Called in room to see patient, effective maternal pushing efforts noted.   Spontaneous vaginal birth of liveborn female patient at 94. Infant immediately to maternal abdomen. Delayed cord clamping. Three (3) vessel cord. APGARS: 9, 9. Weight pending. Receiving nurse present at bedside for birth.   Pitocin infusing. Spontaneous delivery of placenta at 1716. Asymmetrical bilateral periurethral lacerations, hemostatic-unrepaired. Anesthesia: epidural. EBL: 200 ml.    Initiate routine postpartum care and order. Mom to postpartum.  Baby to Couplet care / Skin to Skin.  Family members present at bedside for birth.    Gunnar Bulla, CNM 02/28/2017, 5:35 PM

## 2016-08-11 ENCOUNTER — Ambulatory Visit (INDEPENDENT_AMBULATORY_CARE_PROVIDER_SITE_OTHER): Payer: Medicaid Other | Admitting: Obstetrics and Gynecology

## 2016-08-11 VITALS — BP 109/63 | HR 67 | Ht 61.0 in | Wt 157.4 lb

## 2016-08-11 DIAGNOSIS — Z3481 Encounter for supervision of other normal pregnancy, first trimester: Secondary | ICD-10-CM

## 2016-08-11 DIAGNOSIS — Z3687 Encounter for antenatal screening for uncertain dates: Secondary | ICD-10-CM

## 2016-08-11 DIAGNOSIS — Z113 Encounter for screening for infections with a predominantly sexual mode of transmission: Secondary | ICD-10-CM

## 2016-08-11 DIAGNOSIS — Z1389 Encounter for screening for other disorder: Secondary | ICD-10-CM

## 2016-08-11 NOTE — Patient Instructions (Signed)
Pregnancy and Zika Virus Disease Zika virus disease, or Zika, is an illness that can spread to people from mosquitoes that carry the virus. It may also spread from person to person through infected body fluids. Zika first occurred in [place], but recently it has spread to new areas. The virus occurs in tropical climates. The location of Zika continues to change. Most people who become infected with Zika virus do not develop serious illness. However, Zika may cause birth defects in an unborn baby whose mother is infected with the virus. It may also increase the risk of miscarriage. What are the symptoms of Zika virus disease? In many cases, people who have been infected with Zika virus do not develop any symptoms. If symptoms appear, they usually start about a week after the person is infected. Symptoms are usually mild. They may include:  Fever.  Rash.  Red eyes.  Joint pain. How does Zika virus disease spread? The main way that Zika virus spreads is through the bite of a certain type of mosquito. Unlike most types of mosquitos, which bite only at night, the type of mosquito that carries Zika virus bites both at night and during the day. Zika virus can also spread through sexual contact, through a blood transfusion, and from a mother to her baby before or during birth. Once you have had Zika virus disease, it is unlikely that you will get it again. Can I pass Zika to my baby during pregnancy? Yes, Zika can pass from a mother to her baby before or during birth. What problems can Zika cause for my baby? A woman who is infected with Zika virus while pregnant is at risk of having her baby born with a condition in which the brain or head is smaller than expected (microcephaly). Babies who have microcephaly can have developmental delays, seizures, hearing problems, and vision problems. Having Zika virus disease during pregnancy can also increase the risk of miscarriage. How can Zika virus disease be  prevented? There is no vaccine to prevent Zika. The best way to prevent the disease is to avoid infected mosquitoes and avoid exposure to body fluids that can spread the virus. Avoid any possible exposure to Zika by taking the following precautions. For women and their sex partners:  Avoid traveling to high-risk areas. The locations where Zika is being reported change often. To identify high-risk areas, check the CDC travel website: www.cdc.gov/zika/geo/index.html  If you or your sex partner must travel to a high-risk area, talk with a health care provider before and after traveling.  Take all precautions to avoid mosquito bites if you live in, or travel to, any of the high-risk areas. Insect repellents are safe to use during pregnancy.  Ask your health care provider when it is safe to have sexual contact. For women:  If you are pregnant or trying to become pregnant, avoid sexual contact with persons who may have been exposed to Zika virus, persons who have possible symptoms of Zika, or persons whose history you are unsure about. If you choose to have sexual contact with someone who may have been exposed to Zika virus, use condoms correctly during the entire duration of sexual activity, every time. Do not share sexual devices, as you may be exposed to body fluids.  Ask your health care provider about when it is safe to attempt pregnancy after a possible exposure to Zika virus. What steps should I take to avoid mosquito bites? Take these steps to avoid mosquito bites when you are   in a high-risk area:  Wear loose clothing that covers your arms and legs.  Limit your outdoor activities.  Do not open windows unless they have window screens.  Sleep under mosquito nets.  Use insect repellent. The best insect repellents have:  DEET, picaridin, oil of lemon eucalyptus (OLE), or [id number] in them.  Higher amounts of an active ingredient in them.  Remember that insect repellents are safe to use  during pregnancy.  Do not use OLE on children who are younger than 3 years of age. Do not use insect repellent on babies who are younger than 2 months of age.  Cover your child's stroller with mosquito netting. Make sure the netting fits snugly and that any loose netting does not cover your child's mouth or nose. Do not use a blanket as a mosquito-protection cover.  Do not apply insect repellent underneath clothing.  If you are using sunscreen, apply the sunscreen before applying the insect repellent.  Treat clothing with permethrin. Do not apply permethrin directly to your skin. Follow label directions for safe use.  Get rid of standing water, where mosquitoes may reproduce. Standing water is often found in items such as buckets, bowls, animal food dishes, and flowerpots. When you return from traveling to any high-risk area, continue taking actions to protect yourself against mosquito bites for 3 weeks, even if you show no signs of illness. This will prevent spreading Zika virus to uninfected mosquitoes. What should I know about the sexual transmission of Zika? People can spread Zika to their sexual partners during vaginal, anal, or oral sex, or by sharing sexual devices. Many people with Zika do not develop symptoms, so a person could spread the disease without knowing that they are infected. The greatest risk is to women who are pregnant or who may become pregnant. Zika virus can live longer in semen than it can live in blood. Couples can prevent sexual transmission of the virus by:  Using condoms correctly during the entire duration of sexual activity, every time. This includes vaginal, anal, and oral sex.  Not sharing sexual devices. Sharing increases your risk of being exposed to body fluid from another person.  Avoiding all sexual activity until your health care provider says it is safe. Should I be tested for Zika virus? A sample of your blood can be tested for Zika virus. A pregnant  woman should be tested if she may have been exposed to the virus or if she has symptoms of Zika. She may also have additional tests done during her pregnancy, such ultrasound testing. Talk with your health care provider about which tests are recommended. This information is not intended to replace advice given to you by your health care provider. Make sure you discuss any questions you have with your health care provider. Document Released: 02/03/2015 Document Revised: 10/21/2015 Document Reviewed: 01/27/2015 Elsevier Interactive Patient Education  2017 [employer] Minor Illnesses and Medications in Pregnancy  Cold/Flu:  Sudafed for congestion- Robitussin (plain) for cough- Tylenol for discomfort.  Please follow the directions on the label.  Try not to take any more than needed.  OTC Saline nasal spray and air humidifier or cool-mist  Vaporizer to sooth nasal irritation and to loosen congestion.  It is also important to increase intake of non carbonated fluids, especially if you have a fever.  Constipation:  Colace-2 capsules at bedtime; Metamucil- follow directions on label; Senokot- 1 tablet at bedtime.  Any one of these medications can be used.  It is also   very important to increase fluids and fruits along with regular exercise.  If problem persists please call the office.  Diarrhea:  Kaopectate as directed on the label.  Eat a bland diet and increase fluids.  Avoid highly seasoned foods.  Headache:  Tylenol 1 or 2 tablets every 3-4 hours as needed  Indigestion:  Maalox, Mylanta, Tums or Rolaids- as directed on label.  Also try to eat small meals and avoid fatty, greasy or spicy foods.  Nausea with or without Vomiting:  Nausea in pregnancy is caused by increased levels of hormones in the body which influence the digestive system and cause irritation when stomach acids accumulate.  Symptoms usually subside after 1st trimester of pregnancy.  Try the following: 1. Keep saltines, graham crackers  or dry toast by your bed to eat upon awakening. 2. Don't let your stomach get empty.  Try to eat 5-6 small meals per day instead of 3 large ones. 3. Avoid greasy fatty or highly seasoned foods.  4. Take OTC Unisom 1 tablet at bed time along with OTC Vitamin B6 25-50 mg 3 times per day.    If nausea continues with vomiting and you are unable to keep down food and fluids you may need a prescription medication.  Please notify your provider.   Sore throat:  Chloraseptic spray, throat lozenges and or plain Tylenol.  Vaginal Yeast Infection:  OTC Monistat for 7 days as directed on label.  If symptoms do not resolve within a week notify provider.  If any of the above problems do not subside with recommended treatment please call the office for further assistance.   Do not take Aspirin, Advil, Motrin or Ibuprofen.  * * OTC= Over the counter Hyperemesis Gravidarum Hyperemesis gravidarum is a severe form of nausea and vomiting that happens during pregnancy. Hyperemesis is worse than morning sickness. It may cause you to have nausea or vomiting all day for many days. It may keep you from eating and drinking enough food and liquids. Hyperemesis usually occurs during the first half (the first 20 weeks) of pregnancy. It often goes away once a woman is in her second half of pregnancy. However, sometimes hyperemesis continues through an entire pregnancy. What are the causes? The cause of this condition is not known. It may be related to changes in chemicals (hormones) in the body during pregnancy, such as the high level of pregnancy hormone (human chorionic gonadotropin) or the increase in the female sex hormone (estrogen). What are the signs or symptoms? Symptoms of this condition include:  Severe nausea and vomiting.  Nausea that does not go away.  Vomiting that does not allow you to keep any food down.  Weight loss.  Body fluid loss (dehydration).  Having no desire to eat, or not liking food that you  have previously enjoyed. How is this diagnosed? This condition may be diagnosed based on:  A physical exam.  Your medical history.  Your symptoms.  Blood tests.  Urine tests. How is this treated? This condition may be managed with medicine. If medicines to do not help relieve nausea and vomiting, you may need to receive fluids through an IV tube at the hospital. Follow these instructions at home:  Take over-the-counter and prescription medicines only as told by your health care provider.  Avoid iron pills and multivitamins that contain iron for the first 3-4 months of pregnancy. If you take prescription iron pills, do not stop taking them unless your health care provider approves.  Take the   following actions to help prevent nausea and vomiting:  In the morning, before getting out of bed, try eating a couple of dry crackers or a piece of toast.  Avoid foods and smells that upset your stomach. Fatty and spicy foods may make nausea worse.  Eat 5-6 small meals a day.  Do not drink fluids while eating meals. Drink between meals.  Eat or suck on things that have ginger in them. Ginger can help relieve nausea.  Avoid food preparation. The smell of food can spoil your appetite or trigger nausea.  Follow instructions from your health care provider about eating or drinking restrictions.  For snacks, eat high-protein foods, such as cheese.  Keep all follow-up and pre-birth (prenatal) visits as told by your health care provider. This is important. Contact a health care provider if:  You have pain in your abdomen.  You have a severe headache.  You have vision problems.  You are losing weight. Get help right away if:  You cannot drink fluids without vomiting.  You vomit blood.  You have constant nausea and vomiting.  You are very weak.  You are very thirsty.  You feel dizzy.  You faint.  You have a fever or other symptoms that last for more than 2-3 days.  You  have a fever and your symptoms suddenly get worse. Summary  Hyperemesis gravidarum is a severe form of nausea and vomiting that happens during pregnancy.  Making some changes to your eating habits may help relieve nausea and vomiting.  This condition may be managed with medicine.  If medicines to do not help relieve nausea and vomiting, you may need to receive fluids through an IV tube at the hospital. This information is not intended to replace advice given to you by your health care provider. Make sure you discuss any questions you have with your health care provider. Document Released: 05/15/2005 Document Revised: 01/12/2016 Document Reviewed: 01/12/2016 Elsevier Interactive Patient Education  2017 [employer] First Trimester of Pregnancy The first trimester of pregnancy is from week 1 until the end of week 13 (months 1 through 3). During this time, your baby will begin to develop inside you. At 6-8 weeks, the eyes and face are formed, and the heartbeat can be seen on ultrasound. At the end of 12 weeks, all the baby's organs are formed. Prenatal care is all the medical care you receive before the birth of your baby. Make sure you get good prenatal care and follow all of your doctor's instructions. Follow these instructions at home: Medicines   Take over-the-counter and prescription medicines only as told by your doctor. Some medicines are safe and some medicines are not safe during pregnancy.  Take a prenatal vitamin that contains at least 600 micrograms (mcg) of folic acid.  If you have trouble pooping (constipation), take medicine that will make your stool soft (stool softener) if your doctor approves. Eating and drinking   Eat regular, healthy meals.  Your doctor will tell you the amount of weight gain that is right for you.  Avoid raw meat and uncooked cheese.  If you feel sick to your stomach (nauseous) or throw up (vomit):  Eat 4 or 5 small meals a day instead of 3 large  meals.  Try eating a few soda crackers.  Drink liquids between meals instead of during meals.  To prevent constipation:  Eat foods that are high in fiber, like fresh fruits and vegetables, whole grains, and beans.  Drink enough fluids to   keep your pee (urine) clear or pale yellow. Activity   Exercise only as told by your doctor. Stop exercising if you have cramps or pain in your lower belly (abdomen) or low back.  Do not exercise if it is too hot, too humid, or if you are in a place of great height (high altitude).  Try to avoid standing for long periods of time. Move your legs often if you must stand in one place for a long time.  Avoid heavy lifting.  Wear low-heeled shoes. Sit and stand up straight.  You can have sex unless your doctor tells you not to. Relieving pain and discomfort   Wear a good support bra if your breasts are sore.  Take warm water baths (sitz baths) to soothe pain or discomfort caused by hemorrhoids. Use hemorrhoid cream if your doctor says it is okay.  Rest with your legs raised if you have leg cramps or low back pain.  If you have puffy, bulging veins (varicose veins) in your legs:  Wear support hose or compression stockings as told by your doctor.  Raise (elevate) your feet for 15 minutes, 3-4 times a day.  Limit salt in your food. Prenatal care   Schedule your prenatal visits by the twelfth week of pregnancy.  Write down your questions. Take them to your prenatal visits.  Keep all your prenatal visits as told by your doctor. This is important. Safety   Wear your seat belt at all times when driving.  Make a list of emergency phone numbers. The list should include numbers for family, friends, the hospital, and police and fire departments. General instructions   Ask your doctor for a referral to a local prenatal class. Begin classes no later than at the start of month 6 of your pregnancy.  Ask for help if you need counseling or if you  need help with nutrition. Your doctor can give you advice or tell you where to go for help.  Do not use hot tubs, steam rooms, or saunas.  Do not douche or use tampons or scented sanitary pads.  Do not cross your legs for long periods of time.  Avoid all herbs and alcohol. Avoid drugs that are not approved by your doctor.  Do not use any tobacco products, including cigarettes, chewing tobacco, and electronic cigarettes. If you need help quitting, ask your doctor. You may get counseling or other support to help you quit.  Avoid cat litter boxes and soil used by cats. These carry germs that can cause birth defects in the baby and can cause a loss of your baby (miscarriage) or stillbirth.  Visit your dentist. At home, brush your teeth with a soft toothbrush. Be gentle when you floss. Contact a doctor if:  You are dizzy.  You have mild cramps or pressure in your lower belly.  You have a nagging pain in your belly area.  You continue to feel sick to your stomach, you throw up, or you have watery poop (diarrhea).  You have a bad smelling fluid coming from your vagina.  You have pain when you pee (urinate).  You have increased puffiness (swelling) in your face, hands, legs, or ankles. Get help right away if:  You have a fever.  You are leaking fluid from your vagina.  You have spotting or bleeding from your vagina.  You have very bad belly cramping or pain.  You gain or lose weight rapidly.  You throw up blood. It may look like coffee   grounds.  You are around people who have German measles, fifth disease, or chickenpox.  You have a very bad headache.  You have shortness of breath.  You have any kind of trauma, such as from a fall or a car accident. Summary  The first trimester of pregnancy is from week 1 until the end of week 13 (months 1 through 3).  To take care of yourself and your unborn baby, you will need to eat healthy meals, take medicines only if your doctor  tells you to do so, and do activities that are safe for you and your baby.  Keep all follow-up visits as told by your doctor. This is important as your doctor will have to ensure that your baby is healthy and growing well. This information is not intended to replace advice given to you by your health care provider. Make sure you discuss any questions you have with your health care provider. Document Released: 11/01/2007 Document Revised: 05/23/2016 Document Reviewed: 05/23/2016 Elsevier Interactive Patient Education  2017 [employer] Commonly Asked Questions During Pregnancy  Cats: A parasite can be excreted in cat feces.  To avoid exposure you need to have another person empty the little box.  If you must empty the litter box you will need to wear gloves.  Wash your hands after handling your cat.  This parasite can also be found in raw or undercooked meat so this should also be avoided.  Colds, Sore Throats, Flu: Please check your medication sheet to see what you can take for symptoms.  If your symptoms are unrelieved by these medications please call the office.  Dental Work: Most any dental work your dentist recommends is permitted.  X-rays should only be taken during the first trimester if absolutely necessary.  Your abdomen should be shielded with a lead apron during all x-rays.  Please notify your provider prior to receiving any x-rays.  Novocaine is fine; gas is not recommended.  If your dentist requires a note from us prior to dental work please call the office and we will provide one for you.  Exercise: Exercise is an important part of staying healthy during your pregnancy.  You may continue most exercises you were accustomed to prior to pregnancy.  Later in your pregnancy you will most likely notice you have difficulty with activities requiring balance like riding a bicycle.  It is important that you listen to your body and avoid activities that put you at a higher risk of falling.   Adequate rest and staying well hydrated are a must!  If you have questions about the safety of specific activities ask your provider.    Exposure to Children with illness: Try to avoid obvious exposure; report any symptoms to us when noted,  If you have chicken pos, red measles or mumps, you should be immune to these diseases.   Please do not take any vaccines while pregnant unless you have checked with your OB provider.  Fetal Movement: After 28 weeks we recommend you do "kick counts" twice daily.  Lie or sit down in a calm quiet environment and count your baby movements "kicks".  You should feel your baby at least 10 times per hour.  If you have not felt 10 kicks within the first hour get up, walk around and have something sweet to eat or drink then repeat for an additional hour.  If count remains less than 10 per hour notify your provider.  Fumigating: Follow your pest control agent's advice as   to how long to stay out of your home.  Ventilate the area well before re-entering.  Hemorrhoids:   Most over-the-counter preparations can be used during pregnancy.  Check your medication to see what is safe to use.  It is important to use a stool softener or fiber in your diet and to drink lots of liquids.  If hemorrhoids seem to be getting worse please call the office.   Hot Tubs:  Hot tubs Jacuzzis and saunas are not recommended while pregnant.  These increase your internal body temperature and should be avoided.  Intercourse:  Sexual intercourse is safe during pregnancy as long as you are comfortable, unless otherwise advised by your provider.  Spotting may occur after intercourse; report any bright red bleeding that is heavier than spotting.  Labor:  If you know that you are in labor, please go to the hospital.  If you are unsure, please call the office and let us help you decide what to do.  Lifting, straining, etc:  If your job requires heavy lifting or straining please check with your provider for  any limitations.  Generally, you should not lift items heavier than that you can lift simply with your hands and arms (no back muscles)  Painting:  Paint fumes do not harm your pregnancy, but may make you ill and should be avoided if possible.  Latex or water based paints have less odor than oils.  Use adequate ventilation while painting.  Permanents & Hair Color:  Chemicals in hair dyes are not recommended as they cause increase hair dryness which can increase hair loss during pregnancy.  " Highlighting" and permanents are allowed.  Dye may be absorbed differently and permanents may not hold as well during pregnancy.  Sunbathing:  Use a sunscreen, as skin burns easily during pregnancy.  Drink plenty of fluids; avoid over heating.  Tanning Beds:  Because their possible side effects are still unknown, tanning beds are not recommended.  Ultrasound Scans:  Routine ultrasounds are performed at approximately 20 weeks.  You will be able to see your baby's general anatomy an if you would like to know the gender this can usually be determined as well.  If it is questionable when you conceived you may also receive an ultrasound early in your pregnancy for dating purposes.  Otherwise ultrasound exams are not routinely performed unless there is a medical necessity.  Although you can request a scan we ask that you pay for it when conducted because insurance does not cover " patient request" scans.  Work: If your pregnancy proceeds without complications you may work until your due date, unless your physician or employer advises otherwise.  Round Ligament Pain/Pelvic Discomfort:  Sharp, shooting pains not associated with bleeding are fairly common, usually occurring in the second trimester of pregnancy.  They tend to be worse when standing up or when you remain standing for long periods of time.  These are the result of pressure of certain pelvic ligaments called "round ligaments".  Rest, Tylenol and heat seem to be  the most effective relief.  As the womb and fetus grow, they rise out of the pelvis and the discomfort improves.  Please notify the office if your pain seems different than that described.  It may represent a more serious condition.   

## 2016-08-11 NOTE — Progress Notes (Signed)
Charlann BoxerSamantha K Lilja presents for NOB nurse interview visit. Pregnancy confirmation done [facility]/2016 [facility]. UPT-positive. LMP: 06/22/2016 (unsure). Will order ultrasound for dating and viability. G-2. P-1001. Pregnancy education material explained and given. No cats in the home. NOB labs ordered.  HIV labs and Drug screen were explained optional and she did not decline. Drug screen ordered. Pt currently taking Methadone and when asked what drugs, pt states "what wasn't I taking".  Also admits to using MJ. PNV encouraged. Genetic screening options discussed. Genetic testing: MaterniT.  Pt may discuss with provider if needed. Pt. To follow up with provider in 4 weeks for NOB physical. 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 Her first visit will be with MNB, CNM.  All questions answered.

## 2016-08-12 LAB — HIV ANTIBODY (ROUTINE TESTING W REFLEX): HIV Screen 4th Generation wRfx: NONREACTIVE

## 2016-08-12 LAB — CBC WITH DIFFERENTIAL/PLATELET
Basophils Absolute: 0 10*3/uL (ref 0.0–0.2)
Basos: 0 %
EOS (ABSOLUTE): 0.1 10*3/uL (ref 0.0–0.4)
Eos: 2 %
Hematocrit: 37.9 % (ref 34.0–46.6)
Hemoglobin: 12.4 g/dL (ref 11.1–15.9)
Immature Grans (Abs): 0 10*3/uL (ref 0.0–0.1)
Immature Granulocytes: 0 %
LYMPHS ABS: 0.9 10*3/uL (ref 0.7–3.1)
Lymphs: 20 %
MCH: 30.7 pg (ref 26.6–33.0)
MCHC: 32.7 g/dL (ref 31.5–35.7)
MCV: 94 fL (ref 79–97)
MONOS ABS: 0.3 10*3/uL (ref 0.1–0.9)
Monocytes: 7 %
Neutrophils Absolute: 3.3 10*3/uL (ref 1.4–7.0)
Neutrophils: 71 %
Platelets: 265 10*3/uL (ref 150–379)
RBC: 4.04 x10E6/uL (ref 3.77–5.28)
RDW: 12.7 % (ref 12.3–15.4)
WBC: 4.7 10*3/uL (ref 3.4–10.8)

## 2016-08-12 LAB — HEPATITIS B SURFACE ANTIGEN: Hepatitis B Surface Ag: NEGATIVE

## 2016-08-12 LAB — ANTIBODY SCREEN: ANTIBODY SCREEN: NEGATIVE

## 2016-08-12 LAB — RH TYPE: Rh Factor: POSITIVE

## 2016-08-12 LAB — VARICELLA ZOSTER ANTIBODY, IGG: Varicella zoster IgG: 1511 index (ref >165)

## 2016-08-12 LAB — RUBELLA SCREEN: Rubella Antibodies, IGG: 3.81 index (ref >0.99)

## 2016-08-12 LAB — ABO

## 2016-08-12 LAB — RPR: RPR Ser Ql: NONREACTIVE

## 2016-08-14 LAB — GC/CHLAMYDIA PROBE AMP
Chlamydia trachomatis, NAA: NEGATIVE
Neisseria gonorrhoeae by PCR: POSITIVE — AB

## 2016-08-14 LAB — URINE CULTURE, OB REFLEX

## 2016-08-14 LAB — CULTURE, OB URINE

## 2016-08-15 ENCOUNTER — Other Ambulatory Visit: Payer: Self-pay | Admitting: Obstetrics and Gynecology

## 2016-08-15 DIAGNOSIS — O2340 Unspecified infection of urinary tract in pregnancy, unspecified trimester: Secondary | ICD-10-CM | POA: Insufficient documentation

## 2016-08-15 DIAGNOSIS — A549 Gonococcal infection, unspecified: Secondary | ICD-10-CM | POA: Insufficient documentation

## 2016-08-15 MED ORDER — NITROFURANTOIN MONOHYD MACRO 100 MG PO CAPS: 100.0000 mg | ORAL_CAPSULE | Freq: Two times a day (BID) | ORAL | 1 refills | Status: DC

## 2016-08-16 ENCOUNTER — Ambulatory Visit (INDEPENDENT_AMBULATORY_CARE_PROVIDER_SITE_OTHER): Payer: Medicaid Other | Admitting: Obstetrics and Gynecology

## 2016-08-16 ENCOUNTER — Telehealth: Payer: Self-pay | Admitting: *Deleted

## 2016-08-16 ENCOUNTER — Ambulatory Visit (INDEPENDENT_AMBULATORY_CARE_PROVIDER_SITE_OTHER): Payer: Medicaid Other

## 2016-08-16 VITALS — BP 127/75 | HR 75 | Wt 157.5 lb

## 2016-08-16 DIAGNOSIS — Z3481 Encounter for supervision of other normal pregnancy, first trimester: Secondary | ICD-10-CM

## 2016-08-16 DIAGNOSIS — O99320 Drug use complicating pregnancy, unspecified trimester: Secondary | ICD-10-CM | POA: Diagnosis not present

## 2016-08-16 DIAGNOSIS — O219 Vomiting of pregnancy, unspecified: Secondary | ICD-10-CM

## 2016-08-16 DIAGNOSIS — Z3687 Encounter for antenatal screening for uncertain dates: Secondary | ICD-10-CM

## 2016-08-16 DIAGNOSIS — O98211 Gonorrhea complicating pregnancy, first trimester: Secondary | ICD-10-CM | POA: Diagnosis not present

## 2016-08-16 DIAGNOSIS — F1120 Opioid dependence, uncomplicated: Secondary | ICD-10-CM

## 2016-08-16 MED ORDER — DOXYLAMINE-PYRIDOXINE 10-10 MG PO TBEC: 2.0000 | DELAYED_RELEASE_TABLET | Freq: Every day | ORAL | 5 refills | Status: DC

## 2016-08-16 MED ORDER — CEFTRIAXONE SODIUM 500 MG IJ SOLR
500.0000 mg | Freq: Once | INTRAMUSCULAR | Status: AC
  Administered 2016-08-16: 500 mg via INTRAMUSCULAR

## 2016-08-16 NOTE — Progress Notes (Signed)
OB WORK IN- pt here for dating scan, MNS wanted to see to discuss her + gonorrhea test, pt asked for medication for nausea

## 2016-08-16 NOTE — Progress Notes (Signed)
Viability scan & to discuss labs: Patient notified of +Gonorrhea, states she was treated a moth or so ago and had sex again before shot worked. Will alert FOB to get treated and aware [facility]. 500mg  rocephin IM given today. Also daily nausea with vomiting- diclegis samples given and instructed on use. Will start antibiotic for UTI after tries diclegis.   Indications: Dating for unsure LMP Findings:  Singleton intrauterine pregnancy is visualized with a CRL consistent with [redacted] weeks gestation, giving an (U/S) EDD of 03/28/17. The (U/S) EDD is consistent with the clinically established (LMP) EDD of 03/29/17.  FHR: 158 BPM CRL measurement: 16.1 Yolk sac and and early anatomy appear WNL.  Right Ovary measures 3.2 x 2.0 x 2.0 cm, and appears WNL. Left Ovary measures 3.5 x 1.6 x 2.5 cm, and appears WNL. There is evidence of a corpus luteal cyst in the left ovary. Survey of the adnexa demonstrates no adnexal masses. There is no free peritoneal fluid in the cul de sac.  Impression: 1. 8 week Viable Singleton Intrauterine pregnancy by U/S. 2. (U/S) EDD is consistent with Clinically established (LMP) EDD of 03/29/17.

## 2016-08-16 NOTE — Telephone Encounter (Signed)
-----   Message from Purcell NailsMelody N Shambley, [place] sent at 08/15/2016  9:16 AM EDT ----- Has UTI with E coli- I sent in antibiotic, also gonorrhea +- will need rocephin shot while here tomorrow for viability scan, put her on my schedule to discuss please

## 2016-08-16 NOTE — Telephone Encounter (Signed)
Notified pt of results,she came in for injection

## 2016-08-18 LAB — MONITOR DRUG PROFILE 14(MW)
AMPHETAMINE SCREEN URINE: NEGATIVE ng/mL
BARBITURATE SCREEN URINE: NEGATIVE ng/mL
BENZODIAZEPINE SCREEN, URINE: NEGATIVE ng/mL
Buprenorphine, Urine: NEGATIVE ng/mL
Cocaine (Metab) Scrn, Ur: NEGATIVE ng/mL
Creatinine(Crt), U: 120.9 mg/dL (ref 20.0–300.0)
FENTANYL, URINE: NEGATIVE pg/mL
Meperidine Screen, Urine: NEGATIVE ng/mL
OXYCODONE+OXYMORPHONE UR QL SCN: NEGATIVE ng/mL
Opiate Scrn, Ur: NEGATIVE ng/mL
Ph of Urine: 8.4 (ref 4.5–8.9)
Phencyclidine Qn, Ur: NEGATIVE ng/mL
Propoxyphene Scrn, Ur: NEGATIVE ng/mL
SPECIFIC GRAVITY: 1.022
Tramadol Screen, Urine: NEGATIVE ng/mL

## 2016-08-18 LAB — URINALYSIS, ROUTINE W REFLEX MICROSCOPIC
Bilirubin, UA: NEGATIVE
GLUCOSE, UA: NEGATIVE
Ketones, UA: NEGATIVE
LEUKOCYTES UA: NEGATIVE
Nitrite, UA: NEGATIVE
PROTEIN UA: NEGATIVE
RBC, UA: NEGATIVE
Specific Gravity, UA: 1.021 (ref 1.005–1.030)
UUROB: 0.2 mg/dL (ref 0.2–1.0)
pH, UA: >=9 — AB (ref 5.0–7.5)

## 2016-08-18 LAB — CANNABINOID (GC/MS), URINE
CANNABINOID UR: POSITIVE — AB
CARBOXY THC UR: 171 ng/mL

## 2016-08-18 LAB — METHADONE (GC/LC/MS), URINE
METHADONE GC/MS CONF: 266 ng/mL
METHADONE: POSITIVE — AB

## 2016-08-18 LAB — NICOTINE SCREEN, URINE: COTININE UR QL SCN: POSITIVE ng/mL

## 2016-09-13 ENCOUNTER — Encounter: Payer: Medicaid Other | Admitting: Obstetrics and Gynecology

## 2016-09-13 ENCOUNTER — Encounter: Payer: Self-pay | Admitting: Certified Nurse Midwife

## 2016-09-13 ENCOUNTER — Ambulatory Visit (INDEPENDENT_AMBULATORY_CARE_PROVIDER_SITE_OTHER): Payer: Medicaid Other | Admitting: Certified Nurse Midwife

## 2016-09-13 VITALS — BP 110/70 | HR 83 | Wt 151.6 lb

## 2016-09-13 DIAGNOSIS — O98211 Gonorrhea complicating pregnancy, first trimester: Secondary | ICD-10-CM

## 2016-09-13 DIAGNOSIS — F1120 Opioid dependence, uncomplicated: Secondary | ICD-10-CM | POA: Insufficient documentation

## 2016-09-13 DIAGNOSIS — Z3401 Encounter for supervision of normal first pregnancy, first trimester: Secondary | ICD-10-CM

## 2016-09-13 DIAGNOSIS — O99320 Drug use complicating pregnancy, unspecified trimester: Secondary | ICD-10-CM

## 2016-09-13 DIAGNOSIS — N390 Urinary tract infection, site not specified: Secondary | ICD-10-CM

## 2016-09-13 LAB — POCT URINALYSIS DIPSTICK
Bilirubin, UA: NEGATIVE
Blood, UA: NEGATIVE
GLUCOSE UA: NEGATIVE
KETONES UA: NEGATIVE
Leukocytes, UA: NEGATIVE
Nitrite, UA: NEGATIVE
Protein, UA: NEGATIVE
SPEC GRAV UA: 1.015 (ref 1.010–1.025)
UROBILINOGEN UA: NEGATIVE U/dL — AB
pH, UA: 7.5 (ref 5.0–8.0)

## 2016-09-13 MED ORDER — CONCEPT DHA 53.5-38-1 MG PO CAPS: 1.0000 | ORAL_CAPSULE | Freq: Every day | ORAL | 10 refills | Status: DC

## 2016-09-13 MED ORDER — RANITIDINE HCL 75 MG PO TABS: 75.0000 mg | ORAL_TABLET | Freq: Every day | ORAL | 8 refills | Status: DC

## 2016-09-13 MED ORDER — DOXYLAMINE-PYRIDOXINE 10-10 MG PO TBEC: DELAYED_RELEASE_TABLET | ORAL | 1 refills | Status: DC

## 2016-09-13 MED ORDER — CEFTRIAXONE SODIUM 500 MG IJ SOLR
500.0000 mg | Freq: Once | INTRAMUSCULAR | Status: AC
  Administered 2016-09-13: 500 mg via INTRAMUSCULAR

## 2016-09-13 NOTE — Patient Instructions (Signed)

## 2016-09-13 NOTE — Progress Notes (Signed)
NEW OB HISTORY AND PHYSICAL  SUBJECTIVE:       Amanda Barnett is a 21 y.o. G10P1001 female, Patient's last menstrual period was 06/22/2016 (approximate)., Estimated Date of Delivery: 03/29/17, [redacted]w[redacted]d, presents today for her new OB physical exam. She complains of breast tendernes and nausea. She states that she has been sexually active with her partner since being treated for gonorrhea and he had not been treated yet.    Gynecologic History Patient's last menstrual period was 06/22/2016 (approximate). Normal Contraception: none Last Pap: unsure of date. "it been a while". Results were: normal per pt.   Obstetric History OB History  Gravida Para Term Preterm AB Living  SAB TAB Ectopic Multiple Live Births          1    # Outcome Date GA Lbr Len/2nd Weight Sex Delivery Anes PTL Lv  2 Current           1 Term 08/30/13 [redacted]w[redacted]d  6 lb 11.2 oz (3.039 kg) M Vag-Spont  N LIV      Past Medical History:  Diagnosis Date  . Abdominal pain, recurrent   . Amenorrhea   . Asthma    as a child  . GERD (gastroesophageal reflux disease)   . Infantile colic    resolved    Past Surgical History:  Procedure Laterality Date  . CHOLECYSTECTOMY    . RHINOPLASTY N/A 01/07/2015   Procedure: RHINOPLASTY;  Surgeon: Vernie Murders, MD;  Location: [facility];  Service: ENT;  Laterality: N/A;  . RHINOPLASTY    . SEPTOPLASTY    . SEPTOPLASTY N/A 01/07/2015   Procedure: SEPTOPLASTY;  Surgeon: Vernie Murders, MD;  Location: [facility];  Service: ENT;  Laterality: N/A;  GAVE DISK TO [facility] 8-10 KP    Current Outpatient Prescriptions on File Prior to Visit  Medication Sig Dispense Refill  . Doxylamine-Pyridoxine (DICLEGIS) 10-10 MG TBEC Take 2 tablets by mouth at bedtime. If symptoms persist, add one tablet in the morning and one in the afternoon 100 tablet 5  . methadone (DOLOPHINE) 1 MG/1ML solution Take 75 mg by mouth daily.    . nitrofurantoin, macrocrystal-monohydrate,  (MACROBID) 100 MG capsule Take 1 capsule (100 mg total) by mouth 2 (two) times daily. (Patient not taking: Reported on 08/16/2016) 14 capsule 1  . prenatal vitamin w/FE, FA (NATACHEW) 29-1 MG CHEW chewable tablet Chew 1 tablet by mouth daily at 12 noon.     No current facility-administered medications on file prior to visit.     Allergies  Allergen Reactions  . Latex Itching    Social History   Social History  . Marital status: Single    Spouse name: N/A  . Number of children: N/A  . Years of education: N/A   Occupational History  . Not on file.   Social History Main Topics  . Smoking status: Current Every Day Smoker    Packs/day: 0.25    Years: 2.00    Types: E-cigarettes  . Smokeless tobacco: Never Used  . Alcohol use No  . Drug use: Yes    Types: Marijuana     Comment: METHADONE x4-5 MONTHS  . Sexual activity: Yes    Partners: Male    Birth control/ protection: None   Other Topics Concern  . Not on file   Social History Narrative   11th grade    Family History  Problem Relation Age of Onset  . Pancreatitis Mother   .  Hypertension Mother   . Migraines Mother   . Cholelithiasis Maternal Grandmother   . Rheum arthritis Maternal Grandmother   . Hypertension Maternal Grandmother     The following portions of the patient's history were reviewed and updated as appropriate: allergies, current medications, past OB history, past medical history, past surgical history, past family history, past social history, and problem list.  OBJECTIVE: Initial Physical Exam (New OB)  GENERAL APPEARANCE: alert, well appearing, in no apparent distress, oriented to person, place and time HEAD: normocephalic, atraumatic MOUTH: mucous membranes moist, pharynx normal without lesions THYROID: no thyromegaly or masses present BREASTS: no masses noted, no significant tenderness, no palpable axillary nodes, no skin changes LUNGS: clear to auscultation, no wheezes, rales or rhonchi,  symmetric air entry HEART: regular rate and rhythm, no murmurs ABDOMEN: soft, nontender, nondistended, no abnormal masses, no epigastric pain EXTREMITIES: no redness or tenderness in the calves or thighs SKIN: normal coloration and turgor, no rashes LYMPH NODES: no adenopathy palpable NEUROLOGIC: alert, oriented, normal speech, no focal findings or movement disorder noted  PELVIC EXAM EXTERNAL GENITALIA: normal appearing vulva with no masses, tenderness or lesions VAGINA: no abnormal discharge or lesions CERVIX: no lesions or cervical motion tenderness UTERUS: gravid ADNEXA: no masses palpable and nontender OB EXAM PELVIMETRY: appears adequate RECTUM: exam not indicated  ASSESSMENT: Normal pregnancy  PLAN: Rocephin today repeat exposure to gonorrhea. Maternit today. Pregnancy Home form completed today. New OB counseling: The patient has been given an overview regarding routine prenatal care. Recommendations regarding diet, weight gain, medication use, and exercise in pregnancy were given. Prenatal testing, optional genetic testing, and ultrasound use in pregnancy were reviewed.  Benefits of Breast Feeding were discussed. The patient is encouraged to consider nursing her baby post partum.Prenatal care  Doreene Burke, CNM

## 2016-09-18 ENCOUNTER — Other Ambulatory Visit: Payer: Self-pay | Admitting: Certified Nurse Midwife

## 2016-09-18 MED ORDER — AZITHROMYCIN 500 MG PO TABS
1000.0000 mg | ORAL_TABLET | Freq: Once | ORAL | 0 refills | Status: AC
Start: 2016-09-18 — End: 2016-09-18

## 2016-09-19 ENCOUNTER — Other Ambulatory Visit: Payer: Medicaid Other

## 2016-09-19 ENCOUNTER — Telehealth: Payer: Self-pay | Admitting: Certified Nurse Midwife

## 2016-09-19 LAB — PAP IG W/ RFLX HPV ASCU: PAP SMEAR COMMENT: 0

## 2016-09-19 NOTE — Telephone Encounter (Signed)
Called [facility] to discuss pap smear results. Voice mail box not activated. Requested letter sent with results and recommendations of repeat pap smear in one year.   Doreene Burke, CNM

## 2016-09-22 ENCOUNTER — Telehealth: Payer: Self-pay

## 2016-09-22 NOTE — Telephone Encounter (Signed)
Letter for pap results mailed to pt. Unable to reach by phone.

## 2016-09-24 LAB — MATERNIT 21 PLUS CORE, BLOOD
CHROMOSOME 13: NEGATIVE
Chromosome 18: NEGATIVE
Chromosome 21: NEGATIVE
Y Chromosome: DETECTED

## 2016-10-12 ENCOUNTER — Encounter: Payer: Self-pay | Admitting: Certified Nurse Midwife

## 2016-10-12 ENCOUNTER — Ambulatory Visit (INDEPENDENT_AMBULATORY_CARE_PROVIDER_SITE_OTHER): Payer: Medicaid Other | Admitting: Certified Nurse Midwife

## 2016-10-12 VITALS — BP 101/68 | HR 74 | Wt 152.6 lb

## 2016-10-12 DIAGNOSIS — F1120 Opioid dependence, uncomplicated: Secondary | ICD-10-CM

## 2016-10-12 DIAGNOSIS — O09892 Supervision of other high risk pregnancies, second trimester: Secondary | ICD-10-CM

## 2016-10-12 DIAGNOSIS — O99320 Drug use complicating pregnancy, unspecified trimester: Secondary | ICD-10-CM

## 2016-10-12 DIAGNOSIS — Z3482 Encounter for supervision of other normal pregnancy, second trimester: Secondary | ICD-10-CM | POA: Diagnosis not present

## 2016-10-12 LAB — POCT URINALYSIS DIPSTICK
Bilirubin, UA: NEGATIVE
GLUCOSE UA: NEGATIVE
KETONES UA: NEGATIVE
Nitrite, UA: NEGATIVE
Protein, UA: NEGATIVE
RBC UA: NEGATIVE
SPEC GRAV UA: 1.01 (ref 1.010–1.025)
UROBILINOGEN UA: 0.2 U/dL
pH, UA: 6 (ref 5.0–8.0)

## 2016-10-12 NOTE — Progress Notes (Signed)
ROB-Pt doing well, no questions or concerns. [facility]. Reviewed red flag symptoms and when to call. RTC x 4 weeks for anatomy scan and ROB.

## 2016-10-12 NOTE — Patient Instructions (Signed)
Round Ligament Pain The round ligament is a cord of muscle and tissue that helps to support the uterus. It can become a source of pain during pregnancy if it becomes stretched or twisted as the baby grows. The pain usually begins in the second trimester of pregnancy, and it can come and go until the baby is delivered. It is not a serious problem, and it does not cause harm to the baby. Round ligament pain is usually a short, sharp, and pinching pain, but it can also be a dull, lingering, and aching pain. The pain is felt in the lower side of the abdomen or in the groin. It usually starts deep in the groin and moves up to the outside of the hip area. Pain can occur with:  A sudden change in position.  Rolling over in bed.  Coughing or sneezing.  Physical activity. Follow these instructions at home: Watch your condition for any changes. Take these steps to help with your pain:  When the pain starts, relax. Then try:  Sitting down.  Flexing your knees up to your abdomen.  Lying on your side with one pillow under your abdomen and another pillow between your legs.  Sitting in a warm bath for 15-20 minutes or until the pain goes away.  Take over-the-counter and prescription medicines only as told by your health care provider.  Move slowly when you sit and stand.  Avoid long walks if they cause pain.  Stop or lessen your physical activities if they cause pain. Contact a health care provider if:  Your pain does not go away with treatment.  You feel pain in your back that you did not have before.  Your medicine is not helping. Get help right away if:  You develop a fever or chills.  You develop uterine contractions.  You develop vaginal bleeding.  You develop nausea or vomiting.  You develop diarrhea.  You have pain when you urinate. This information is not intended to replace advice given to you by your health care provider. Make sure you discuss any questions you have with  your health care provider. Document Released: 02/22/2008 Document Revised: 10/21/2015 Document Reviewed: 07/22/2014 Elsevier Interactive Patient Education  2017 Elsevier Inc.  

## 2016-10-14 LAB — GC/CHLAMYDIA PROBE AMP
Chlamydia trachomatis, NAA: NEGATIVE
Neisseria gonorrhoeae by PCR: POSITIVE — AB

## 2016-10-24 NOTE — Progress Notes (Signed)
Would you please contact pt and have her come to the office for treatment. Thanks, JML

## 2016-10-27 ENCOUNTER — Telehealth: Payer: Self-pay | Admitting: Certified Nurse Midwife

## 2016-10-27 ENCOUNTER — Ambulatory Visit (INDEPENDENT_AMBULATORY_CARE_PROVIDER_SITE_OTHER): Payer: Medicaid Other | Admitting: Certified Nurse Midwife

## 2016-10-27 VITALS — BP 86/52 | HR 98 | Wt 152.4 lb

## 2016-10-27 DIAGNOSIS — O98211 Gonorrhea complicating pregnancy, first trimester: Secondary | ICD-10-CM

## 2016-10-27 MED ORDER — AZITHROMYCIN 500 MG PO TABS: 1000.0000 mg | ORAL_TABLET | Freq: Once | ORAL | 1 refills | Status: AC

## 2016-10-27 MED ORDER — CEFTRIAXONE SODIUM 500 MG IJ SOLR
250.0000 mg | Freq: Once | INTRAMUSCULAR | Status: AC
  Administered 2016-10-27: 250 mg via INTRAMUSCULAR

## 2016-10-27 NOTE — Patient Instructions (Signed)
Ceftriaxone injection What is this medicine? CEFTRIAXONE (sef try AX one) is a cephalosporin antibiotic. It is used to treat certain kinds of bacterial infections. It will not work for colds, flu, or other viral infections. This medicine may be used for other purposes; ask your health care provider or pharmacist if you have questions. COMMON BRAND NAME(S): Rocephin What should I tell my health care provider before I take this medicine? They need to know if you have any of these conditions: -any chronic illness -bowel disease, like colitis -both kidney and liver disease -high bilirubin level in newborn patients -an unusual or allergic reaction to ceftriaxone, other cephalosporin or penicillin antibiotics, foods, dyes, or preservatives -pregnant or trying to get pregnant -breast-feeding How should I use this medicine? This medicine is injected into a muscle or infused it into a vein. It is usually given in a medical office or clinic. If you are to give this medicine you will be taught how to inject it. Follow instructions carefully. Use your doses at regular intervals. Do not take your medicine more often than directed. Do not skip doses or stop your medicine early even if you feel better. Do not stop taking except on your doctor's advice. Talk to your pediatrician regarding the use of this medicine in children. Special care may be needed. Overdosage: If you think you have taken too much of this medicine contact a poison control center or emergency room at once. NOTE: This medicine is only for you. Do not share this medicine with others. What if I miss a dose? If you miss a dose, take it as soon as you can. If it is almost time for your next dose, take only that dose. Do not take double or extra doses. What may interact with this medicine? Do not take this medicine with any of the following medications: -intravenous calcium This medicine may also interact with the following medications: -birth  control pills This list may not describe all possible interactions. Give your health care provider a list of all the medicines, herbs, non-prescription drugs, or dietary supplements you use. Also tell them if you smoke, drink alcohol, or use illegal drugs. Some items may interact with your medicine. What should I watch for while using this medicine? Tell your doctor or health care professional if your symptoms do not improve or if they get worse. Do not treat diarrhea with over the counter products. Contact your doctor if you have diarrhea that lasts more than 2 days or if it is severe and watery. If you are being treated for a sexually transmitted disease, avoid sexual contact until you have finished your treatment. Having sex can infect your sexual partner. Calcium may bind to this medicine and cause lung or kidney problems. Avoid calcium products while taking this medicine and for 48 hours after taking the last dose of this medicine. What side effects may I notice from receiving this medicine? Side effects that you should report to your doctor or health care professional as soon as possible: -allergic reactions like skin rash, itching or hives, swelling of the face, lips, or tongue -breathing problems -fever, chills -irregular heartbeat -pain when passing urine -seizures -stomach pain, cramps -unusual bleeding, bruising -unusually weak or tired Side effects that usually do not require medical attention (report to your doctor or health care professional if they continue or are bothersome): -diarrhea -dizzy, drowsy -headache -nausea, vomiting -pain, swelling, irritation where injected -stomach upset -sweating This list may not describe all possible side effects.   Call your doctor for medical advice about side effects. You may report side effects to FDA at 1-800-FDA-1088. Where should I keep my medicine? Keep out of the reach of children. Store at room temperature below 25 degrees C (77  degrees F). Protect from light. Throw away any unused vials after the expiration date. NOTE: This sheet is a summary. It may not cover all possible information. If you have questions about this medicine, talk to your doctor, pharmacist, or health care provider.  2018 Elsevier/Gold Standard (2013-12-01 09:14:54)  

## 2016-10-27 NOTE — Progress Notes (Signed)
Pt is here for ceftriaxone inj per M.Lawhorn CNM + gonorrhea .

## 2016-10-27 NOTE — Telephone Encounter (Signed)
Needs ceftriaxone 250 IM single dose. Send Azithromycin 1 g PO with 1 refill to pharmacy on call. Significant other will have to go somewhere else for injection. JML

## 2016-10-27 NOTE — Telephone Encounter (Signed)
Spoke with pt- she will come in at 330 today for inj. States sig. other already had inj. Will escribe azithromycin to pharmacy of choice.

## 2016-10-27 NOTE — Telephone Encounter (Signed)
Patient lvm in regards to results from recent labs, Patient wants to know if she can come in to get a "shot" to get "treated to cure". Patient would like to speak with a nurse or provider to specify exactly what the patient needs to do. Please advise.

## 2016-11-09 ENCOUNTER — Ambulatory Visit (INDEPENDENT_AMBULATORY_CARE_PROVIDER_SITE_OTHER): Payer: Medicaid Other | Admitting: Obstetrics and Gynecology

## 2016-11-09 ENCOUNTER — Ambulatory Visit (INDEPENDENT_AMBULATORY_CARE_PROVIDER_SITE_OTHER): Payer: Medicaid Other

## 2016-11-09 VITALS — BP 120/62 | HR 76 | Wt 153.0 lb

## 2016-11-09 DIAGNOSIS — Z3482 Encounter for supervision of other normal pregnancy, second trimester: Secondary | ICD-10-CM

## 2016-11-09 DIAGNOSIS — Z3492 Encounter for supervision of normal pregnancy, unspecified, second trimester: Secondary | ICD-10-CM

## 2016-11-09 LAB — POCT URINALYSIS DIPSTICK
Bilirubin, UA: NEGATIVE
GLUCOSE UA: NEGATIVE
Ketones, UA: NEGATIVE
Leukocytes, UA: NEGATIVE
Nitrite, UA: NEGATIVE
Protein, UA: NEGATIVE
Spec Grav, UA: 1.015 (ref 1.010–1.025)
UROBILINOGEN UA: 0.2 U/dL
pH, UA: 7 (ref 5.0–8.0)

## 2016-11-09 NOTE — Progress Notes (Signed)
ROB & anatomy scan-  Indications:Anatomy U/S Findings:  Singleton intrauterine pregnancy is visualized with FHR at 123 BPM. Biometrics give an (U/S) Gestational age of 23 2/7 weeks and an (U/S) EDD of 03/27/17; this correlates with the clinically established EDD of 03/29/17.  Fetal presentation is Vertex.  EFW: 352g (12oz). Placenta: Anterior, grade 0, central cord insert, 5.6 cm from internal os.. AFI: Subjectively adequate.  Anatomic survey is complete and normal; Gender - female  .   Ovaries are not seen Survey of the adnexa demonstrates no adnexal masses. There is no free peritoneal fluid in the cul de sac.  Impression: 1. 20 2/7 week Viable Singleton Intrauterine pregnancy by U/S. 2. (U/S) EDD is consistent with Clinically established (LMP) EDD of 03/29/17. 3. Normal Anatomy Scan

## 2016-11-09 NOTE — Progress Notes (Signed)
ROB- anatomy scan done today, pt is doing well 

## 2016-12-08 ENCOUNTER — Encounter: Payer: Self-pay | Admitting: Certified Nurse Midwife

## 2016-12-08 ENCOUNTER — Ambulatory Visit (INDEPENDENT_AMBULATORY_CARE_PROVIDER_SITE_OTHER): Payer: Medicaid Other | Admitting: Certified Nurse Midwife

## 2016-12-08 VITALS — BP 114/61 | HR 73 | Wt 153.6 lb

## 2016-12-08 DIAGNOSIS — Z131 Encounter for screening for diabetes mellitus: Secondary | ICD-10-CM

## 2016-12-08 DIAGNOSIS — Z3492 Encounter for supervision of normal pregnancy, unspecified, second trimester: Secondary | ICD-10-CM

## 2016-12-08 DIAGNOSIS — Z130 Encounter for screening for diseases of the blood and blood-forming organs and certain disorders involving the immune mechanism: Secondary | ICD-10-CM

## 2016-12-08 LAB — POCT URINALYSIS DIPSTICK
BILIRUBIN UA: NEGATIVE
Blood, UA: NEGATIVE
GLUCOSE UA: NEGATIVE
Ketones, UA: NEGATIVE
Leukocytes, UA: NEGATIVE
Nitrite, UA: NEGATIVE
Protein, UA: NEGATIVE
SPEC GRAV UA: 1.015 (ref 1.010–1.025)
Urobilinogen, UA: 0.2 E.U./dL
pH, UA: 5 (ref 5.0–8.0)

## 2016-12-08 MED ORDER — SERTRALINE HCL 50 MG PO TABS: 50.0000 mg | ORAL_TABLET | Freq: Every day | ORAL | 0 refills | Status: DC

## 2016-12-08 MED ORDER — VALACYCLOVIR HCL 1 G PO TABS: 2000.0000 mg | ORAL_TABLET | Freq: Two times a day (BID) | ORAL | 4 refills | Status: AC

## 2016-12-08 NOTE — Patient Instructions (Signed)

## 2016-12-08 NOTE — Progress Notes (Signed)
Pt would like to restart antidepressants and valtrex

## 2016-12-08 NOTE — Addendum Note (Signed)
Addended by: Mechele ClaudeHOMPSON, Murry Diaz M on: 12/08/2016 10:32 AM   Modules accepted: Orders

## 2016-12-08 NOTE — Progress Notes (Signed)
ROB, doing well. States that she has had some recurrent cold sore and would like valtrex to help treat. She is also stating that she would like to start medication for her anxiety and depression. She has taken Wellbutrin and Zoloft in the past. She states that she is having increased feeling of being overwhelmed and is tearful. Discussed benefits and risks of SSRI (Zoloft in pregnancy) agrees to plan. Will start on Zolort 50 mg. Return in 12 wks for med check. Will have 1 hr GTT, Tdab , and BTC , growth U/s at next visit.TOC for gonorrhea today.  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 She verbalizes understanding and agrees to plan. Follow up 2 wks .   Doreene BurkeAnnie Everley Evora, CNM

## 2016-12-15 ENCOUNTER — Other Ambulatory Visit: Payer: Self-pay | Admitting: Certified Nurse Midwife

## 2016-12-15 ENCOUNTER — Telehealth: Payer: Self-pay | Admitting: Certified Nurse Midwife

## 2016-12-15 LAB — NUSWAB VAGINITIS PLUS (VG+)
Atopobium vaginae: HIGH Score — AB
BVAB 2: HIGH Score — AB
CANDIDA ALBICANS, NAA: NEGATIVE
CANDIDA GLABRATA, NAA: NEGATIVE
CHLAMYDIA TRACHOMATIS, NAA: NEGATIVE
Megasphaera 1: HIGH Score — AB
NEISSERIA GONORRHOEAE, NAA: NEGATIVE
Trich vag by NAA: NEGATIVE

## 2016-12-15 MED ORDER — METRONIDAZOLE 500 MG PO TABS: 500.0000 mg | ORAL_TABLET | Freq: Two times a day (BID) | ORAL | 0 refills | Status: AC

## 2016-12-15 NOTE — Progress Notes (Signed)
Nuswab shows BV, prescription sent to pharmacy on file.   Doreene BurkeAnnie Sandip Power, CNM

## 2016-12-15 NOTE — Telephone Encounter (Signed)
Pt informed of results fo NUSwab. Positive for BV. Discussed treatment. Verbalizes understanding prescription to pharmacy on file.   Doreene BurkeAnnie Yashua Bracco, CNM

## 2016-12-22 ENCOUNTER — Encounter: Payer: Medicaid Other | Admitting: Certified Nurse Midwife

## 2016-12-28 ENCOUNTER — Ambulatory Visit: Admission: RE | Admit: 2016-12-28 | Payer: Medicaid Other | Source: Ambulatory Visit

## 2017-01-05 ENCOUNTER — Encounter: Payer: Medicaid Other | Admitting: Certified Nurse Midwife

## 2017-01-05 ENCOUNTER — Other Ambulatory Visit: Payer: Medicaid Other

## 2017-01-09 ENCOUNTER — Telehealth: Payer: Self-pay

## 2017-01-09 NOTE — Telephone Encounter (Signed)
Pt missed [facility] appt on 12/28/16 and did not show here for her growth US, 1hr gtt, ROB and f/u med check regarding Zoloft. Pt rescheduling appts here and given [facility] phone number to reschedule her appt there.

## 2017-01-11 ENCOUNTER — Ambulatory Visit: Payer: Medicaid Other

## 2017-01-11 DIAGNOSIS — Z3492 Encounter for supervision of normal pregnancy, unspecified, second trimester: Secondary | ICD-10-CM

## 2017-01-11 NOTE — Addendum Note (Signed)
Addended by: Marchelle FolksMILLER, Giuseppina Quinones G on: 01/11/2017 09:47 AM   Modules accepted: Orders

## 2017-01-12 ENCOUNTER — Encounter: Payer: Medicaid Other | Admitting: Certified Nurse Midwife

## 2017-01-12 ENCOUNTER — Telehealth: Payer: Self-pay | Admitting: Certified Nurse Midwife

## 2017-01-12 LAB — CBC
Hematocrit: 36.3 % (ref 34.0–46.6)
Hemoglobin: 12.3 g/dL (ref 11.1–15.9)
MCH: 31.7 pg (ref 26.6–33.0)
MCHC: 33.9 g/dL (ref 31.5–35.7)
MCV: 94 fL (ref 79–97)
Platelets: 223 x10E3/uL (ref 150–379)
RBC: 3.88 x10E6/uL (ref 3.77–5.28)
RDW: 13.3 % (ref 12.3–15.4)
WBC: 6.8 x10E3/uL (ref 3.4–10.8)

## 2017-01-12 LAB — GLUCOSE, 1 HOUR GESTATIONAL: GESTATIONAL DIABETES SCREEN: 63 mg/dL — AB (ref 65–139)

## 2017-01-12 NOTE — Telephone Encounter (Signed)
Called [facility] to discuss lab results. 1 hr GTT low @ 63, reviewed importance of well balanced diet with lean protein, snack between meals to maintain blood sugar. She agrees to plan. Follow up as scheduled.   Doreene Burke, CNM

## 2017-01-16 ENCOUNTER — Encounter: Payer: Self-pay | Admitting: Certified Nurse Midwife

## 2017-01-16 ENCOUNTER — Ambulatory Visit (INDEPENDENT_AMBULATORY_CARE_PROVIDER_SITE_OTHER): Payer: Medicaid Other | Admitting: Certified Nurse Midwife

## 2017-01-16 VITALS — BP 110/65 | HR 67 | Wt 156.1 lb

## 2017-01-16 DIAGNOSIS — Z3483 Encounter for supervision of other normal pregnancy, third trimester: Secondary | ICD-10-CM

## 2017-01-16 LAB — POCT URINALYSIS DIPSTICK
BILIRUBIN UA: NEGATIVE
GLUCOSE UA: NEGATIVE
KETONES UA: NEGATIVE
Nitrite, UA: NEGATIVE
PH UA: 7 (ref 5.0–8.0)
Protein, UA: NEGATIVE
Spec Grav, UA: 1.01 (ref 1.010–1.025)
Urobilinogen, UA: 0.2 E.U./dL

## 2017-01-16 NOTE — Progress Notes (Signed)
Amanda Barnett, doing well. Complains for pressure and common discomforts of pregnancy. She had small blood and Lueks in urine today. She denies burning, urgency or frequency. She will return in 2 wks for Amanda Barnett

## 2017-01-16 NOTE — Patient Instructions (Signed)

## 2017-01-30 ENCOUNTER — Observation Stay
Admission: EM | Admit: 2017-01-30 | Discharge: 2017-01-30 | Disposition: A | Discharge status: Home / Self Care | Payer: Medicaid Other | Attending: Obstetrics and Gynecology | Admitting: Obstetrics and Gynecology

## 2017-01-30 ENCOUNTER — Ambulatory Visit (INDEPENDENT_AMBULATORY_CARE_PROVIDER_SITE_OTHER): Payer: Medicaid Other | Admitting: Obstetrics and Gynecology

## 2017-01-30 VITALS — BP 112/67 | HR 88 | Wt 157.0 lb

## 2017-01-30 DIAGNOSIS — R102 Pelvic and perineal pain: Secondary | ICD-10-CM | POA: Insufficient documentation

## 2017-01-30 DIAGNOSIS — Z3490 Encounter for supervision of normal pregnancy, unspecified, unspecified trimester: Secondary | ICD-10-CM

## 2017-01-30 DIAGNOSIS — Z3A32 32 weeks gestation of pregnancy: Secondary | ICD-10-CM | POA: Insufficient documentation

## 2017-01-30 DIAGNOSIS — R109 Unspecified abdominal pain: Secondary | ICD-10-CM | POA: Diagnosis not present

## 2017-01-30 DIAGNOSIS — O26893 Other specified pregnancy related conditions, third trimester: Secondary | ICD-10-CM | POA: Diagnosis not present

## 2017-01-30 DIAGNOSIS — Z3493 Encounter for supervision of normal pregnancy, unspecified, third trimester: Secondary | ICD-10-CM

## 2017-01-30 LAB — URINALYSIS, COMPLETE (UACMP) WITH MICROSCOPIC
Bilirubin Urine: NEGATIVE
Glucose, UA: NEGATIVE mg/dL
Ketones, ur: 5 mg/dL — AB
Nitrite: NEGATIVE
PROTEIN: NEGATIVE mg/dL
SPECIFIC GRAVITY, URINE: 1.012 (ref 1.005–1.030)
pH: 7 (ref 5.0–8.0)

## 2017-01-30 MED ORDER — METRONIDAZOLE 500 MG PO TABS
500.0000 mg | ORAL_TABLET | Freq: Two times a day (BID) | ORAL | 0 refills | Status: DC
Start: 2017-01-30 — End: 2017-02-26

## 2017-01-30 NOTE — OB Triage Note (Signed)
0640 Pt to OBS 2 at 31.5 with c/o abd and vaginal pain.  Pt up to bathroom to change into hospital gown.  EFM explained and applied.  No acute distress noted at this time.

## 2017-01-30 NOTE — Progress Notes (Signed)
ROB- pt is super stressed having a lot of issues, has to find new place to live, new car, FOB got locked up this weekend, was @ ER this am,pain R upper abd

## 2017-01-30 NOTE — Progress Notes (Signed)
ROB- needs to increase water intake, working on living situation, FOB locked up due to back child support. States BV is back.

## 2017-01-31 ENCOUNTER — Telehealth: Payer: Self-pay | Admitting: Obstetrics and Gynecology

## 2017-01-31 ENCOUNTER — Other Ambulatory Visit: Payer: Self-pay | Admitting: *Deleted

## 2017-01-31 MED ORDER — SERTRALINE HCL 50 MG PO TABS: 50.0000 mg | ORAL_TABLET | Freq: Every day | ORAL | 3 refills | Status: DC

## 2017-01-31 NOTE — Telephone Encounter (Signed)
Done-ac 

## 2017-01-31 NOTE — Telephone Encounter (Signed)
Please send refill for zoloft to [facility] [employer] asap so patient can pick up both medications at the same time

## 2017-02-04 NOTE — Discharge Summary (Signed)
L&D OB Triage Note  Charlann BoxerSamantha K Wombles is a 21 y.o. G2P1001 female at 6990w3d, EDD Estimated Date of Delivery: 03/29/17 who presented to triage for complaints of lower abdominal pain and vaginal pressures since last night.  She was evaluated by the nurses with no significant findings/findings significant for labor or fetal concerns. Vital signs stable. An NST was performed and has been reviewed by myself. She was treated with po hydration. Cervix was closed.  NST INTERPRETATION: Indications: rule out uterine contractions  Mode:  (off for discharge) Baseline Rate (A): 125 bpm Variability: Moderate Accelerations: 10 x 10 Decelerations: None     Contraction Frequency (min): irritability  Impression: reactive   Plan: NST performed was reviewed and was found to be reactive. She was discharged home with bleeding/labor precautions.  Continue routine prenatal care. Follow up with OB/GYN as previously scheduled.     Melody Suzan NailerN Shambley, CNM

## 2017-02-14 ENCOUNTER — Encounter: Payer: Medicaid Other | Admitting: Certified Nurse Midwife

## 2017-02-22 ENCOUNTER — Encounter: Payer: Self-pay | Admitting: Certified Nurse Midwife

## 2017-02-22 ENCOUNTER — Ambulatory Visit (INDEPENDENT_AMBULATORY_CARE_PROVIDER_SITE_OTHER): Payer: Medicaid Other | Admitting: Certified Nurse Midwife

## 2017-02-22 VITALS — BP 111/61 | HR 71 | Wt 161.7 lb

## 2017-02-22 DIAGNOSIS — F1120 Opioid dependence, uncomplicated: Secondary | ICD-10-CM

## 2017-02-22 DIAGNOSIS — Z23 Encounter for immunization: Secondary | ICD-10-CM

## 2017-02-22 DIAGNOSIS — Z3493 Encounter for supervision of normal pregnancy, unspecified, third trimester: Secondary | ICD-10-CM | POA: Diagnosis not present

## 2017-02-22 DIAGNOSIS — O99323 Drug use complicating pregnancy, third trimester: Secondary | ICD-10-CM

## 2017-02-22 LAB — POCT URINALYSIS DIPSTICK
Bilirubin, UA: NEGATIVE
Blood, UA: NEGATIVE
Glucose, UA: NEGATIVE
KETONES UA: NEGATIVE
LEUKOCYTES UA: NEGATIVE
Nitrite, UA: NEGATIVE
PH UA: 6 (ref 5.0–8.0)
Protein, UA: NEGATIVE
Spec Grav, UA: 1.01 (ref 1.010–1.025)
UROBILINOGEN UA: 0.2 U/dL

## 2017-02-22 MED ORDER — BUPROPION HCL ER (XL) 150 MG PO TB24: 150.0000 mg | ORAL_TABLET | Freq: Every day | ORAL | 0 refills | Status: DC

## 2017-02-22 NOTE — Patient Instructions (Addendum)
Fetal Movement Counts Patient Name: ________________________________________________ Patient Due Date: ____________________ What is a fetal movement count? A fetal movement count is the number of times that you feel your baby move during a certain amount of time. This may also be called a fetal kick count. A fetal movement count is recommended for every pregnant woman. You may be asked to start counting fetal movements as early as week 28 of your pregnancy. Pay attention to when your baby is most active. You may notice your baby's sleep and wake cycles. You may also notice things that make your baby move more. You should do a fetal movement count:  When your baby is normally most active.  At the same time each day.  A good time to count movements is while you are resting, after having something to eat and drink. How do I count fetal movements? 1. Find a quiet, comfortable area. Sit, or lie down on your side. 2. Write down the date, the start time and stop time, and the number of movements that you felt between those two times. Take this information with you to your health care visits. 3. For 2 hours, count kicks, flutters, swishes, rolls, and jabs. You should feel at least 10 movements during 2 hours. 4. You may stop counting after you have felt 10 movements. 5. If you do not feel 10 movements in 2 hours, have something to eat and drink. Then, keep resting and counting for 1 hour. If you feel at least 4 movements during that hour, you may stop counting. Contact a health care provider if:  You feel fewer than 4 movements in 2 hours.  Your baby is not moving like he or she usually does. Date: ____________ Start time: ____________ Stop time: ____________ Movements: ____________ Date: ____________ Start time: ____________ Stop time: ____________ Movements: ____________ Date: ____________ Start time: ____________ Stop time: ____________ Movements: ____________ Date: ____________ Start time:  ____________ Stop time: ____________ Movements: ____________ Date: ____________ Start time: ____________ Stop time: ____________ Movements: ____________ Date: ____________ Start time: ____________ Stop time: ____________ Movements: ____________ Date: ____________ Start time: ____________ Stop time: ____________ Movements: ____________ Date: ____________ Start time: ____________ Stop time: ____________ Movements: ____________ Date: ____________ Start time: ____________ Stop time: ____________ Movements: ____________ This information is not intended to replace advice given to you by your health care provider. Make sure you discuss any questions you have with your health care provider. Document Released: 06/14/2006 Document Revised: 01/12/2016 Document Reviewed: 06/24/2015 Elsevier Interactive Patient Education  2018 [employer]. [employer] of the uterus can occur throughout pregnancy, but they are not always a sign that you are in labor. You may have practice contractions called Braxton Hicks contractions. These false labor contractions are sometimes confused with true labor. What are [place] contractions? Braxton Hicks contractions are tightening movements that occur in the muscles of the uterus before labor. Unlike true labor contractions, these contractions do not result in opening (dilation) and thinning of the cervix. Toward the end of pregnancy (32-34 weeks), Braxton Hicks contractions can happen more often and may become stronger. These contractions are sometimes difficult to tell apart from true labor because they can be very uncomfortable. You should not feel embarrassed if you go to the hospital with false labor. Sometimes, the only way to tell if you are in true labor is for your health care provider to look for changes in the cervix. The health care provider will do a physical exam and may monitor your contractions. If  you are not in true labor, the exam  should show that your cervix is not dilating and your water has not broken. If there are no prenatal problems or other health problems associated with your pregnancy, it is completely safe for you to be sent home with false labor. You may continue to have Braxton Hicks contractions until you go into true labor. How can I tell the difference between true labor and false labor?  Differences ? False labor ? Contractions last 30-70 seconds.: Contractions are usually shorter and not as strong as true labor contractions. ? Contractions become very regular.: Contractions are usually irregular. ? Discomfort is usually felt in the top of the uterus, and it spreads to the lower abdomen and low back.: Contractions are often felt in the front of the lower abdomen and in the groin. ? Contractions do not go away with walking.: Contractions may go away when you walk around or change positions while lying down. ? Contractions usually become more intense and increase in frequency.: Contractions get weaker and are shorter-lasting as time goes on. ? The cervix dilates and gets thinner.: The cervix usually does not dilate or become thin. Follow these instructions at home:  Take over-the-counter and prescription medicines only as told by your health care provider.  Keep up with your usual exercises and follow other instructions from your health care provider.  Eat and drink lightly if you think you are going into labor.  If Braxton Hicks contractions are making you uncomfortable: ? Change your position from lying down or resting to walking, or change from walking to resting. ? Sit and rest in a tub of warm water. ? Drink enough fluid to keep your urine clear or pale yellow. Dehydration may cause these contractions. ? Do slow and deep breathing several times an hour.  Keep all follow-up prenatal visits as told by your health care provider. This is important. Contact a health care provider if:  You have a  fever.  You have continuous pain in your abdomen. Get help right away if:  Your contractions become stronger, more regular, and closer together.  You have fluid leaking or gushing from your vagina.  You pass blood-tinged mucus (bloody show).  You have bleeding from your vagina.  You have low back pain that you never had before.  You feel your baby's head pushing down and causing pelvic pressure.  Your baby is not moving inside you as much as it used to. Summary  Contractions that occur before labor are called Braxton Hicks contractions, false labor, or practice contractions.  Braxton Hicks contractions are usually shorter, weaker, farther apart, and less regular than true labor contractions. True labor contractions usually become progressively stronger and regular and they become more frequent.  Manage discomfort from [facility] contractions by changing position, resting in a warm bath, drinking plenty of water, or practicing deep breathing. This information is not intended to replace advice given to you by your health care provider. Make sure you discuss any questions you have with your health care provider. Document Released: 05/15/2005 Document Revised: 04/03/2016 Document Reviewed: 04/03/2016 Elsevier Interactive Patient Education  2017 Else[employer] Inc.  Pregnancy and Influenza Influenza, also called the flu, is an infection of the respiratory tract. If you are pregnant, you are more likely to catch the flu. You are also more likely to have a more serious case of the flu. This is because pregnancy lowers your body's ability to fight off infections (it weakens your immune system).  It also puts additional stress on your heart and lungs, which makes you more likely to have complications. Having a bad case of the flu, especially with a high fever, can be dangerous for your developing baby. It can cause you to go into early labor. How do people get the flu? The flu is caused by the  influenza virus. This virus is common every year in the fall and winter. It spreads when virus particles get passed from person to person. You can get the virus if you are near a sick person who is coughing or sneezing. You can also get the virus if you touch something that has the virus on it and then touch your face. How can I protect myself against the flu?  Get a flu shot. The best way to prevent the flu is to get a flu shot before flu season starts. The flu shot is not dangerous for your developing baby. It may even help protect your baby from the flu for up to 6 months after birth. The flu shot is one type of flu vaccine. Another type is a nasal spray vaccine. Do not get the nasal spray vaccine. It is not approved for pregnancy.  Do not come in close contact with sick people.  Do not share food, drinks, or utensils with other people.  Wash your hands often. Use hand sanitizer when soap and water are not available. What should I do if I have flu symptoms? If you have any flu symptoms, call your health care provider right away. Flu symptoms include:  Fever or chills.  Muscle aches.  Headache.  Sore throat.  Nasal congestion.  Cough.  Feeling tired.  Loss of appetite.  Vomiting.  Diarrhea.  You may be able to take an antiviral medicine to keep the flu from becoming severe and to shorten how long it lasts. What should I do at home if I am diagnosed with the flu?  Do not take any medicine, including cold or flu medicine, unless directed by your health care provider.  If you take antiviral medicine, make sure you finish it even if you start to feel better.  Drink enough fluid to keep your urine clear or pale yellow.  Get plenty of rest. When would I seek immediate medical care if I have the flu?  You have trouble breathing.  You have chest pain.  You begin to have labor pains.  You have a high fever that does not go down after you take medicine.  You do not feel  your baby move.  You have diarrhea or vomiting that will not go away. This information is not intended to replace advice given to you by your health care provider. Make sure you discuss any questions you have with your health care provider. Document Released: 03/17/2008 Document Revised: 10/21/2015 Document Reviewed: 04/11/2013 Elsevier Interactive Patient Education  2017 [employer]. Tdap Vaccine (Tetanus, Diphtheria and Pertussis): What You Need to Know 1. Why get vaccinated? Tetanus, diphtheria and pertussis are very serious diseases. Tdap vaccine can protect [place] from these diseases. And, Tdap vaccine given to pregnant women can protect newborn babies against pertussis. TETANUS (Lockjaw) is rare in the [place] today. It causes painful muscle tightening and stiffness, usually all over the body.  It can lead to tightening of muscles in the head and neck so you can't open your mouth, swallow, or sometimes even breathe. Tetanus kills about 1 out of 10 people who are infected even after receiving the  best medical care.  DIPHTHERIA is also rare in the [place] today. It can cause a thick coating to form in the back of the throat.  It can lead to breathing problems, heart failure, paralysis, and death.  PERTUSSIS (Whooping Cough) causes severe coughing spells, which can cause difficulty breathing, vomiting and disturbed sleep.  It can also lead to weight loss, incontinence, and rib fractures. Up to 2 in 100 adolescents and 5 in 100 adults with pertussis are hospitalized or have complications, which could include pneumonia or death.  These diseases are caused by bacteria. Diphtheria and pertussis are spread from person to person through secretions from coughing or sneezing. Tetanus enters the body through cuts, scratches, or wounds. Before vaccines, as many as 200,000 cases of diphtheria, 200,000 cases of pertussis, and hundreds of cases of tetanus, were reported in the [place]  each year. Since vaccination began, reports of cases for tetanus and diphtheria have dropped by about 99% and for pertussis by about 80%. 2. Tdap vaccine Tdap vaccine can protect adolescents and adults from tetanus, diphtheria, and pertussis. One dose of Tdap is routinely given at age 50 or 22. People who did not get Tdap at that age should get it as soon as possible. Tdap is especially important for healthcare professionals and anyone having close contact with a baby younger than 12 months. Pregnant women should get a dose of Tdap during every pregnancy, to protect the newborn from pertussis. Infants are most at risk for severe, life-threatening complications from pertussis. Another vaccine, called Td, protects against tetanus and diphtheria, but not pertussis. A Td booster should be given every 10 years. Tdap may be given as one of these boosters if you have never gotten Tdap before. Tdap may also be given after a severe cut or burn to prevent tetanus infection. Your doctor or the person giving you the vaccine can give you more information. Tdap may safely be given at the same time as other vaccines. 3. Some people should not get this vaccine  A person who has ever had a life-threatening allergic reaction after a previous dose of any diphtheria, tetanus or pertussis containing vaccine, OR has a severe allergy to any part of this vaccine, should not get Tdap vaccine. Tell the person giving the vaccine about any severe allergies.  Anyone who had coma or long repeated seizures within 7 days after a childhood dose of DTP or DTaP, or a previous dose of Tdap, should not get Tdap, unless a cause other than the vaccine was found. They can still get Td.  Talk to your doctor if you: ? have seizures or another nervous system problem, ? had severe pain or swelling after any vaccine containing diphtheria, tetanus or pertussis, ? ever had a condition called Guillain-Barr Syndrome (GBS), ? aren't feeling well  on the day the shot is scheduled. 4. Risks With any medicine, including vaccines, there is a chance of side effects. These are usually mild and go away on their own. Serious reactions are also possible but are rare. Most people who get Tdap vaccine do not have any problems with it. Mild problems following Tdap: (Did not interfere with activities)  Pain where the shot was given (about 3 in 4 adolescents or 2 in 3 adults)  Redness or swelling where the shot was given (about 1 person in 5)  Mild fever of at least 100.52F (up to about 1 in 25 adolescents or 1 in 100 adults)  Headache (about 3 or  4 people in 10)  Tiredness (about 1 person in 3 or 4)  Nausea, vomiting, diarrhea, stomach ache (up to 1 in 4 adolescents or 1 in 10 adults)  Chills, sore joints (about 1 person in 10)  Body aches (about 1 person in 3 or 4)  Rash, swollen glands (uncommon)  Moderate problems following Tdap: (Interfered with activities, but did not require medical attention)  Pain where the shot was given (up to 1 in 5 or 6)  Redness or swelling where the shot was given (up to about 1 in 16 adolescents or 1 in 12 adults)  Fever over 102F (about 1 in 100 adolescents or 1 in 250 adults)  Headache (about 1 in 7 adolescents or 1 in 10 adults)  Nausea, vomiting, diarrhea, stomach ache (up to 1 or 3 people in 100)  Swelling of the entire arm where the shot was given (up to about 1 in 500).  Severe problems following Tdap: (Unable to perform usual activities; required medical attention)  Swelling, severe pain, bleeding and redness in the arm where the shot was given (rare).  Problems that could happen after any vaccine:  People sometimes faint after a medical procedure, including vaccination. Sitting or lying down for about 15 minutes can help prevent fainting, and injuries caused by a fall. Tell your doctor if you feel dizzy, or have vision changes or ringing in the ears.  Some people get severe pain in  the shoulder and have difficulty moving the arm where a shot was given. This happens very rarely.  Any medication can cause a severe allergic reaction. Such reactions from a vaccine are very rare, estimated at fewer than 1 in a million doses, and would happen within a few minutes to a few hours after the vaccination. As with any medicine, there is a very remote chance of a vaccine causing a serious injury or death. The safety of vaccines is always being monitored. For more information, visit: http://floyd.org/ 5. What if there is a serious problem? What should I look for? Look for anything that concerns you, such as signs of a severe allergic reaction, very high fever, or unusual behavior. Signs of a severe allergic reaction can include hives, swelling of the face and throat, difficulty breathing, a fast heartbeat, dizziness, and weakness. These would usually start a few minutes to a few hours after the vaccination. What should I do?  If you think it is a severe allergic reaction or other emergency that can't wait, call 9-1-1 or get the person to the nearest hospital. Otherwise, call your doctor.  Afterward, the reaction should be reported to the Vaccine Adverse Event Reporting System (VAERS). Your doctor might file this report, or you can do it yourself through the VAERS web site at www.vaers.LAgents.no, or by calling 1-279-386-5172. ? VAERS does not give medical advice. 6. The National Vaccine Injury Compensation Program The [employer] Vaccine Injury Compensation Program (VICP) is a federal program that was created to compensate people who may have been injured by certain vaccines. Persons who believe they may have been injured by a vaccine can learn about the program and about filing a claim by calling 1-601-226-5839 or visiting the VICP website at SpiritualWord.at. There is a time limit to file a claim for compensation. 7. How can I learn more?  Ask your doctor. He or she  can give you the vaccine package insert or suggest other sources of information.  Call your local or state health department.  Contact the Centers  for Disease Control and Prevention (CDC): ? Call (848) 865-5921 (1-800-CDC-INFO) or ? Visit CDC's website at PicCapture.uy CDC Tdap Vaccine VIS (07/22/13) This information is not intended to replace advice given to you by your health care provider. Make sure you discuss any questions you have with your health care provider. Document Released: 11/14/2011 Document Revised: 02/03/2016 Document Reviewed: 02/03/2016 Elsevier Interactive Patient Education  2017 Elsevier Inc. Influenza (Flu) Vaccine (Inactivated or Recombinant): What You Need to Know 1. Why get vaccinated? Influenza ("flu") is a contagious disease that spreads around the [place] every year, usually between October and May. Flu is caused by influenza viruses, and is spread mainly by coughing, sneezing, and close contact. Anyone can get flu. Flu strikes suddenly and can last several days. Symptoms vary by age, but can include:  fever/chills  sore throat  muscle aches  fatigue  cough  headache  runny or stuffy nose  Flu can also lead to pneumonia and blood infections, and cause diarrhea and seizures in children. If you have a medical condition, such as heart or lung disease, flu can make it worse. Flu is more dangerous for some people. Infants and young children, people 96 years of age and older, pregnant women, and people with certain health conditions or a weakened immune system are at greatest risk. Each year thousands of people in the [place] die from flu, and many more are hospitalized. Flu vaccine can:  keep you from getting flu,  make flu less severe if you do get it, and  keep you from spreading flu to your family and other people. 2. Inactivated and recombinant flu vaccines A dose of flu vaccine is recommended every flu season. Children 6 months  through 41 years of age may need two doses during the same flu season. Everyone else needs only one dose each flu season. Some inactivated flu vaccines contain a very small amount of a mercury-based preservative called thimerosal. Studies have not shown thimerosal in vaccines to be harmful, but flu vaccines that do not contain thimerosal are available. There is no live flu virus in flu shots. They cannot cause the flu. There are many flu viruses, and they are always changing. Each year a new flu vaccine is made to protect against three or four viruses that are likely to cause disease in the upcoming flu season. But even when the vaccine doesn't exactly match these viruses, it may still provide some protection. Flu vaccine cannot prevent:  flu that is caused by a virus not covered by the vaccine, or  illnesses that look like flu but are not.  It takes about 2 weeks for protection to develop after vaccination, and protection lasts through the flu season. 3. Some people should not get this vaccine Tell the person who is giving you the vaccine:  If you have any severe, life-threatening allergies. If you ever had a life-threatening allergic reaction after a dose of flu vaccine, or have a severe allergy to any part of this vaccine, you may be advised not to get vaccinated. Most, but not all, types of flu vaccine contain a small amount of egg protein.  If you ever had Guillain-Barr Syndrome (also called GBS). Some people with a history of GBS should not get this vaccine. This should be discussed with your doctor.  If you are not feeling well. It is usually okay to get flu vaccine when you have a mild illness, but you might be asked to come back when you feel better.  4. Risks of a vaccine reaction With any medicine, including vaccines, there is a chance of reactions. These are usually mild and go away on their own, but serious reactions are also possible. Most people who get a flu shot do not have any  problems with it. Minor problems following a flu shot include:  soreness, redness, or swelling where the shot was given  hoarseness  sore, red or itchy eyes  cough  fever  aches  headache  itching  fatigue  If these problems occur, they usually begin soon after the shot and last 1 or 2 days. More serious problems following a flu shot can include the following:  There may be a small increased risk of Guillain-Barre Syndrome (GBS) after inactivated flu vaccine. This risk has been estimated at 1 or 2 additional cases per million people vaccinated. This is much lower than the risk of severe complications from flu, which can be prevented by flu vaccine.  Young children who get the flu shot along with pneumococcal vaccine (PCV13) and/or DTaP vaccine at the same time might be slightly more likely to have a seizure caused by fever. Ask your doctor for more information. Tell your doctor if a child who is getting flu vaccine has ever had a seizure.  Problems that could happen after any injected vaccine:  People sometimes faint after a medical procedure, including vaccination. Sitting or lying down for about 15 minutes can help prevent fainting, and injuries caused by a fall. Tell your doctor if you feel dizzy, or have vision changes or ringing in the ears.  Some people get severe pain in the shoulder and have difficulty moving the arm where a shot was given. This happens very rarely.  Any medication can cause a severe allergic reaction. Such reactions from a vaccine are very rare, estimated at about 1 in a million doses, and would happen within a few minutes to a few hours after the vaccination. As with any medicine, there is a very remote chance of a vaccine causing a serious injury or death. The safety of vaccines is always being monitored. For more information, visit: http://floyd.org/ 5. What if there is a serious reaction? What should I look for? Look for anything that  concerns you, such as signs of a severe allergic reaction, very high fever, or unusual behavior. Signs of a severe allergic reaction can include hives, swelling of the face and throat, difficulty breathing, a fast heartbeat, dizziness, and weakness. These would start a few minutes to a few hours after the vaccination. What should I do?  If you think it is a severe allergic reaction or other emergency that can't wait, call 9-1-1 and get the person to the nearest hospital. Otherwise, call your doctor.  Reactions should be reported to the Vaccine Adverse Event Reporting System (VAERS). Your doctor should file this report, or you can do it yourself through the VAERS web site at www.vaers.LAgents.no, or by calling 1-9516809960. ? VAERS does not give medical advice. 6. The National Vaccine Injury Compensation Program The [employer] Vaccine Injury Compensation Program (VICP) is a federal program that was created to compensate people who may have been injured by certain vaccines. Persons who believe they may have been injured by a vaccine can learn about the program and about filing a claim by calling 1-229-703-6738 or visiting the [employer]IC[employer] website at SpiritualWord.at. There is a time limit to file a claim for compensation. 7. How can I learn more?  Ask your healthcare provider. He or  she can give you the vaccine package insert or suggest other sources of information.  Call your local or state health department.  Contact the Centers for Disease Control and Prevention (CDC): ? Call 587-875-8524 (1-800-CDC-INFO) or ? Visit CDC's website at BiotechRoom.com.cy Vaccine Information Statement, Inactivated Influenza Vaccine (01/02/2014) This information is not intended to replace advice given to you by your health care provider. Make sure you discuss any questions you have with your health care provider. Document Released: 03/09/2006 Document Revised: 02/03/2016 Document Reviewed: 02/03/2016 Elsevier  Interactive Patient Education  2017 [employer].

## 2017-02-22 NOTE — Progress Notes (Signed)
ROB-Reports intermittent round ligament pain and sciatic nerve pulling. Discussed home treatment measures. 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 Neonatal Abstinence Syndrome brochure reviewed and given. Desires smoking cessation with medication and therapy. Rx Wellbutrin, see orders. TDaP and Flu vaccine given today. Blood transfusion consent signed. Pregnancy home form reassessment completed. Meeting with Jola Babinski, pregnancy case manager, today. RTC x 1 week for growth [place], 36 week cultures, and ROB with Pattricia Boss

## 2017-02-25 ENCOUNTER — Observation Stay
Admission: EM | Admit: 2017-02-25 | Discharge: 2017-02-26 | Disposition: A | Discharge status: Home / Self Care | Payer: Medicaid Other | Attending: Obstetrics and Gynecology | Admitting: Obstetrics and Gynecology

## 2017-02-25 DIAGNOSIS — O9989 Other specified diseases and conditions complicating pregnancy, childbirth and the puerperium: Principal | ICD-10-CM | POA: Insufficient documentation

## 2017-02-25 DIAGNOSIS — Z3490 Encounter for supervision of normal pregnancy, unspecified, unspecified trimester: Secondary | ICD-10-CM

## 2017-02-25 DIAGNOSIS — O99323 Drug use complicating pregnancy, third trimester: Secondary | ICD-10-CM | POA: Diagnosis not present

## 2017-02-25 DIAGNOSIS — M545 Low back pain: Secondary | ICD-10-CM | POA: Diagnosis not present

## 2017-02-25 DIAGNOSIS — Z3A35 35 weeks gestation of pregnancy: Secondary | ICD-10-CM | POA: Diagnosis not present

## 2017-02-25 DIAGNOSIS — F1120 Opioid dependence, uncomplicated: Secondary | ICD-10-CM | POA: Diagnosis not present

## 2017-02-25 LAB — URINALYSIS, ROUTINE W REFLEX MICROSCOPIC
Bilirubin Urine: NEGATIVE
Glucose, UA: NEGATIVE mg/dL
Ketones, ur: 20 mg/dL — AB
NITRITE: NEGATIVE
PH: 7 (ref 5.0–8.0)
Protein, ur: NEGATIVE mg/dL
SPECIFIC GRAVITY, URINE: 1.012 (ref 1.005–1.030)

## 2017-02-25 MED ORDER — ZOLPIDEM TARTRATE 5 MG PO TABS
5.0000 mg | ORAL_TABLET | Freq: Every evening | ORAL | Status: DC | PRN
  Administered 2017-02-25: 5 mg via ORAL
  Filled 2017-02-25: qty 1

## 2017-02-25 MED ORDER — ACETAMINOPHEN 500 MG PO TABS
1000.0000 mg | ORAL_TABLET | Freq: Four times a day (QID) | ORAL | Status: DC | PRN
  Administered 2017-02-25: 1000 mg via ORAL
  Filled 2017-02-25: qty 2

## 2017-02-25 MED ORDER — LACTATED RINGERS IV SOLN
INTRAVENOUS | Status: DC
  Administered 2017-02-25: via INTRAVENOUS

## 2017-02-25 NOTE — OB Triage Note (Signed)
Patient came in for observation for lower abdominal pain and pressure and shortness of breath since Friday evening. Patient rates pain 4/10. Patient reports + FM at this time. Patient denies uterine contractions at this time. Patient denies leaking of fluid and vaginal bleeding at this time. ut denies vaginal bleeding and spotting. Vital signs stable and patient afebrile. FHR baseline 145with moderate variability with accelerations 10 x 10. Family at bedside. Will continue to monitor.

## 2017-02-25 NOTE — OB Triage Provider Note (Signed)
Amanda Barnett is a 21 y.o. G2P1001 at [redacted]w[redacted]d who is observed for dehdration and rule out PTL.  Estimated Date of Delivery: 03/29/17 Fetal presentation is cephalic.  Length of Stay:  0 Days. Admitted 02/25/2017  Subjective: Low back pain and in general not feeling well, states only ate one time today Patient reports good fetal movement.  She reports no uterine contractions, no bleeding and no loss of fluid per vagina.  Vitals:  Blood pressure 131/82, pulse 92, temperature 98.5 F (36.9 C), temperature source Oral, resp. rate 19, height  (1.549 m), weight 161 lb (73 kg), last menstrual period 06/22/2016, SpO2 98 %. Physical Examination: CONSTITUTIONAL: Well-developed, well-nourished female in no acute distress.  CERVIX: Dilation: 2.5 Effacement (%): 50 Cervical Position: Middle Station: -1 Exam by:: RS  Fetal monitoring: FHR: 150 bpm, Variability: moderate, Accelerations: Present, Decelerations: Absent  Uterine activity: 6 contractions per hour  Results for orders placed or performed during the hospital encounter of 02/25/17 (from the past 48 hour(s))  Urinalysis, Routine w reflex microscopic     Status: Abnormal   Collection Time: 02/25/17 10:16 PM  Result Value Ref Range   Color, Urine YELLOW (A) YELLOW   APPearance CLEAR (A) CLEAR   Specific Gravity, Urine 1.012 1.005 - 1.030   pH 7.0 5.0 - 8.0   Glucose, UA NEGATIVE NEGATIVE mg/dL   Hgb urine dipstick SMALL (A) NEGATIVE   Bilirubin Urine NEGATIVE NEGATIVE   Ketones, ur 20 (A) NEGATIVE mg/dL   Protein, ur NEGATIVE NEGATIVE mg/dL   Nitrite NEGATIVE NEGATIVE   Leukocytes, UA TRACE (A) NEGATIVE   RBC / HPF 6-30 0 - 5 RBC/hpf   WBC, UA 0-5 0 - 5 WBC/hpf   Bacteria, UA RARE (A) NONE SEEN   Squamous Epithelial / LPF 6-30 (A) NONE SEEN    No results found.  Current scheduled medications   I have reviewed the patient's current medications.  ASSESSMENT: Patient Active Problem List   Diagnosis Date Noted  . Pregnancy  01/30/2017  . 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 Gonorrhea 08/15/2016  . Anxiety 07/03/2014  . Depression 07/03/2014  . Nausea & vomiting 04/07/2011    PLAN: IV hydration and rest, urine sent for culture and GBS cultures obtained. Continue routine antenatal   Vicy Medico N Welda Azzarello, CNM [facility]

## 2017-02-26 ENCOUNTER — Observation Stay
Admission: EM | Admit: 2017-02-26 | Discharge: 2017-02-26 | Disposition: A | Discharge status: Home / Self Care | Payer: Medicaid Other | Attending: Certified Nurse Midwife | Admitting: Certified Nurse Midwife

## 2017-02-26 DIAGNOSIS — Z91040 Latex allergy status: Secondary | ICD-10-CM | POA: Diagnosis not present

## 2017-02-26 DIAGNOSIS — O368130 Decreased fetal movements, third trimester, not applicable or unspecified: Principal | ICD-10-CM | POA: Insufficient documentation

## 2017-02-26 DIAGNOSIS — O99323 Drug use complicating pregnancy, third trimester: Secondary | ICD-10-CM | POA: Diagnosis not present

## 2017-02-26 DIAGNOSIS — Z3A35 35 weeks gestation of pregnancy: Secondary | ICD-10-CM

## 2017-02-26 DIAGNOSIS — Z79899 Other long term (current) drug therapy: Secondary | ICD-10-CM | POA: Diagnosis not present

## 2017-02-26 DIAGNOSIS — F1120 Opioid dependence, uncomplicated: Secondary | ICD-10-CM

## 2017-02-26 DIAGNOSIS — F1190 Opioid use, unspecified, uncomplicated: Secondary | ICD-10-CM | POA: Diagnosis not present

## 2017-02-26 DIAGNOSIS — O99320 Drug use complicating pregnancy, unspecified trimester: Secondary | ICD-10-CM

## 2017-02-26 DIAGNOSIS — O9989 Other specified diseases and conditions complicating pregnancy, childbirth and the puerperium: Secondary | ICD-10-CM | POA: Diagnosis not present

## 2017-02-26 LAB — URINE DRUG SCREEN, QUALITATIVE (ARMC ONLY)
Amphetamines, Ur Screen: NOT DETECTED
BARBITURATES, UR SCREEN: NOT DETECTED
Benzodiazepine, Ur Scrn: NOT DETECTED
COCAINE METABOLITE, UR ~~LOC~~: NOT DETECTED
Cannabinoid 50 Ng, Ur ~~LOC~~: POSITIVE — AB
MDMA (ECSTASY) UR SCREEN: NOT DETECTED
METHADONE SCREEN, URINE: POSITIVE — AB
OPIATE, UR SCREEN: NOT DETECTED
Phencyclidine (PCP) Ur S: NOT DETECTED
TRICYCLIC, UR SCREEN: NOT DETECTED

## 2017-02-26 MED ORDER — HYDROXYZINE HCL 25 MG PO TABS
25.0000 mg | ORAL_TABLET | Freq: Three times a day (TID) | ORAL | Status: DC | PRN
  Administered 2017-02-26: 25 mg via ORAL
  Filled 2017-02-26 (×3): qty 1

## 2017-02-26 MED ORDER — ACETAMINOPHEN 500 MG PO TABS
1000.0000 mg | ORAL_TABLET | Freq: Four times a day (QID) | ORAL | Status: DC | PRN
  Administered 2017-02-26: 1000 mg via ORAL
  Filled 2017-02-26: qty 2

## 2017-02-26 NOTE — Progress Notes (Signed)
Pt discharged home in stable condition with family. Pt instructed to follow up next Monday with prenatal appt. Pt given and discussed discharge instructions. Pt denies any questions or concerns at this time.

## 2017-02-26 NOTE — Discharge Instructions (Signed)
Call or return to birthplace for :  Uterine contractions every 5 minutes, lasting a minute for one hour  Decreased fetal movement  Leaking of fluid   Vaginal bleeding
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
## 2017-02-26 NOTE — Discharge Summary (Signed)
Patient discharged with instructions on follow up appointment, labor precautions, pain management, and when to seek medical attention. Patient reports + FM at discharge. Patient discharged with family. Patient verbalized understanding of discharge instructions. Patient discharged in wheelchair to visitor entrance with family due to medication for sleep was given during stay.

## 2017-02-27 ENCOUNTER — Other Ambulatory Visit: Payer: Self-pay | Admitting: Obstetrics and Gynecology

## 2017-02-27 DIAGNOSIS — O99820 Streptococcus B carrier state complicating pregnancy: Secondary | ICD-10-CM | POA: Insufficient documentation

## 2017-02-27 LAB — CULTURE, BETA STREP (GROUP B ONLY)

## 2017-02-28 ENCOUNTER — Inpatient Hospital Stay
Admission: EM | Admit: 2017-02-28 | Discharge: 2017-03-02 | DRG: 806 | Disposition: A | Discharge status: Home / Self Care | Payer: Medicaid Other | Attending: Certified Nurse Midwife | Admitting: Certified Nurse Midwife

## 2017-02-28 ENCOUNTER — Inpatient Hospital Stay: Payer: Medicaid Other | Admitting: Anesthesiology

## 2017-02-28 ENCOUNTER — Other Ambulatory Visit: Payer: Self-pay | Admitting: Obstetrics and Gynecology

## 2017-02-28 DIAGNOSIS — O99324 Drug use complicating childbirth: Secondary | ICD-10-CM | POA: Diagnosis present

## 2017-02-28 DIAGNOSIS — F329 Major depressive disorder, single episode, unspecified: Secondary | ICD-10-CM | POA: Diagnosis present

## 2017-02-28 DIAGNOSIS — O99334 Smoking (tobacco) complicating childbirth: Secondary | ICD-10-CM | POA: Diagnosis present

## 2017-02-28 DIAGNOSIS — F1190 Opioid use, unspecified, uncomplicated: Secondary | ICD-10-CM | POA: Diagnosis present

## 2017-02-28 DIAGNOSIS — Z3A35 35 weeks gestation of pregnancy: Secondary | ICD-10-CM

## 2017-02-28 DIAGNOSIS — O99344 Other mental disorders complicating childbirth: Secondary | ICD-10-CM | POA: Diagnosis present

## 2017-02-28 DIAGNOSIS — F17210 Nicotine dependence, cigarettes, uncomplicated: Secondary | ICD-10-CM | POA: Diagnosis present

## 2017-02-28 DIAGNOSIS — F419 Anxiety disorder, unspecified: Secondary | ICD-10-CM | POA: Diagnosis present

## 2017-02-28 DIAGNOSIS — O99824 Streptococcus B carrier state complicating childbirth: Secondary | ICD-10-CM | POA: Diagnosis present

## 2017-02-28 LAB — CBC
HEMATOCRIT: 36 % (ref 35.0–47.0)
HEMOGLOBIN: 12.4 g/dL (ref 12.0–16.0)
MCH: 31.4 pg (ref 26.0–34.0)
MCHC: 34.4 g/dL (ref 32.0–36.0)
MCV: 91.3 fL (ref 80.0–100.0)
Platelets: 228 10*3/uL (ref 150–440)
RBC: 3.94 MIL/uL (ref 3.80–5.20)
RDW: 13.1 % (ref 11.5–14.5)
WBC: 10.1 10*3/uL (ref 3.6–11.0)

## 2017-02-28 LAB — URINE DRUG SCREEN, QUALITATIVE (ARMC ONLY)
AMPHETAMINES, UR SCREEN: NOT DETECTED
BENZODIAZEPINE, UR SCRN: NOT DETECTED
Barbiturates, Ur Screen: NOT DETECTED
Cannabinoid 50 Ng, Ur ~~LOC~~: NOT DETECTED
Cocaine Metabolite,Ur ~~LOC~~: NOT DETECTED
MDMA (ECSTASY) UR SCREEN: NOT DETECTED
METHADONE SCREEN, URINE: POSITIVE — AB
Opiate, Ur Screen: NOT DETECTED
PHENCYCLIDINE (PCP) UR S: NOT DETECTED
TRICYCLIC, UR SCREEN: NOT DETECTED

## 2017-02-28 LAB — CHLAMYDIA/NGC RT PCR (ARMC ONLY)
CHLAMYDIA TR: NOT DETECTED
N gonorrhoeae: NOT DETECTED

## 2017-02-28 MED ORDER — IBUPROFEN 200 MG PO TABS
600.0000 mg | ORAL_TABLET | Freq: Four times a day (QID) | ORAL | Status: DC
  Administered 2017-02-28 – 2017-03-02 (×8): 600 mg via ORAL
  Filled 2017-02-28 (×7): qty 3

## 2017-02-28 MED ORDER — PHENYLEPHRINE 40 MCG/ML (10ML) SYRINGE FOR IV PUSH (FOR BLOOD PRESSURE SUPPORT)
80.0000 ug | PREFILLED_SYRINGE | INTRAVENOUS | Status: DC | PRN
Start: 2017-02-28 — End: 2017-02-28
  Filled 2017-02-28: qty 5

## 2017-02-28 MED ORDER — SODIUM CHLORIDE 0.9 % IV SOLN
2.0000 g | Freq: Once | INTRAVENOUS | Status: AC
  Administered 2017-02-28: 2 g via INTRAVENOUS
  Filled 2017-02-28: qty 2000

## 2017-02-28 MED ORDER — ONDANSETRON HCL 4 MG/2ML IJ SOLN: 4.0000 mg | INTRAMUSCULAR | Status: DC | PRN

## 2017-02-28 MED ORDER — BENZOCAINE-MENTHOL 20-0.5 % EX AERO
1.0000 "application " | INHALATION_SPRAY | CUTANEOUS | Status: DC | PRN
  Administered 2017-02-28 – 2017-03-02 (×2): 1 via TOPICAL
  Filled 2017-02-28 (×2): qty 56

## 2017-02-28 MED ORDER — BUTORPHANOL TARTRATE 1 MG/ML IJ SOLN: 1.0000 mg | INTRAMUSCULAR | Status: DC | PRN

## 2017-02-28 MED ORDER — LIDOCAINE HCL (PF) 1 % IJ SOLN
INTRAMUSCULAR | Status: AC
  Filled 2017-02-28: qty 30

## 2017-02-28 MED ORDER — AMMONIA AROMATIC IN INHA
RESPIRATORY_TRACT | Status: AC
  Filled 2017-02-28: qty 10

## 2017-02-28 MED ORDER — SIMETHICONE 80 MG PO CHEW: 80.0000 mg | CHEWABLE_TABLET | ORAL | Status: DC | PRN

## 2017-02-28 MED ORDER — LACTATED RINGERS IV SOLN
INTRAVENOUS | Status: DC
  Administered 2017-02-28: 1000 mL via INTRAVENOUS

## 2017-02-28 MED ORDER — LIDOCAINE-EPINEPHRINE (PF) 1.5 %-1:200000 IJ SOLN
INTRAMUSCULAR | Status: DC | PRN
Start: 2017-02-28 — End: 2017-02-28
  Administered 2017-02-28: 3 mL via PERINEURAL

## 2017-02-28 MED ORDER — MISOPROSTOL 200 MCG PO TABS
ORAL_TABLET | ORAL | Status: AC
  Filled 2017-02-28: qty 4

## 2017-02-28 MED ORDER — EPHEDRINE 5 MG/ML INJ
10.0000 mg | INTRAVENOUS | Status: DC | PRN
  Filled 2017-02-28: qty 2

## 2017-02-28 MED ORDER — OXYCODONE-ACETAMINOPHEN 5-325 MG PO TABS: 1.0000 | ORAL_TABLET | ORAL | Status: DC | PRN

## 2017-02-28 MED ORDER — DIPHENHYDRAMINE HCL 50 MG/ML IJ SOLN: 12.5000 mg | INTRAMUSCULAR | Status: DC | PRN

## 2017-02-28 MED ORDER — OXYTOCIN 10 UNIT/ML IJ SOLN
INTRAMUSCULAR | Status: AC
  Filled 2017-02-28: qty 2

## 2017-02-28 MED ORDER — SENNOSIDES-DOCUSATE SODIUM 8.6-50 MG PO TABS
2.0000 | ORAL_TABLET | ORAL | Status: DC
  Administered 2017-03-01 – 2017-03-02 (×2): 2 via ORAL
  Filled 2017-02-28 (×2): qty 2

## 2017-02-28 MED ORDER — OXYCODONE-ACETAMINOPHEN 5-325 MG PO TABS
2.0000 | ORAL_TABLET | ORAL | Status: DC | PRN
Start: 2017-02-28 — End: 2017-02-28

## 2017-02-28 MED ORDER — OXYTOCIN 40 UNITS IN LACTATED RINGERS INFUSION - SIMPLE MED
2.5000 [IU]/h | INTRAVENOUS | Status: DC
  Administered 2017-02-28: 2.5 [IU]/h via INTRAVENOUS
  Filled 2017-02-28: qty 1000

## 2017-02-28 MED ORDER — LACTATED RINGERS IV SOLN
500.0000 mL | INTRAVENOUS | Status: DC | PRN
  Administered 2017-02-28: 500 mL via INTRAVENOUS

## 2017-02-28 MED ORDER — SERTRALINE HCL 25 MG PO TABS
50.0000 mg | ORAL_TABLET | Freq: Every day | ORAL | Status: DC
  Administered 2017-02-28 – 2017-03-02 (×3): 50 mg via ORAL
  Filled 2017-02-28 (×3): qty 2

## 2017-02-28 MED ORDER — AMOXICILLIN 500 MG PO CAPS: 500.0000 mg | ORAL_CAPSULE | Freq: Three times a day (TID) | ORAL | 2 refills | Status: DC

## 2017-02-28 MED ORDER — OXYTOCIN BOLUS FROM INFUSION
500.0000 mL | Freq: Once | INTRAVENOUS | Status: AC
  Administered 2017-02-28: 500 mL via INTRAVENOUS

## 2017-02-28 MED ORDER — FENTANYL 2.5 MCG/ML W/ROPIVACAINE 0.15% IN NS 100 ML EPIDURAL (ARMC): 12.0000 mL/h | EPIDURAL | Status: DC

## 2017-02-28 MED ORDER — BUPIVACAINE HCL (PF) 0.25 % IJ SOLN
INTRAMUSCULAR | Status: DC | PRN
  Administered 2017-02-28: 3 mL via EPIDURAL
  Administered 2017-02-28: 5 mL via EPIDURAL

## 2017-02-28 MED ORDER — ACETAMINOPHEN 325 MG PO TABS: 650.0000 mg | ORAL_TABLET | ORAL | Status: DC | PRN

## 2017-02-28 MED ORDER — DIPHENHYDRAMINE HCL 25 MG PO CAPS: 25.0000 mg | ORAL_CAPSULE | Freq: Four times a day (QID) | ORAL | Status: DC | PRN

## 2017-02-28 MED ORDER — ONDANSETRON HCL 4 MG/2ML IJ SOLN: 4.0000 mg | Freq: Four times a day (QID) | INTRAMUSCULAR | Status: DC | PRN

## 2017-02-28 MED ORDER — FLEET ENEMA 7-19 GM/118ML RE ENEM: 1.0000 | ENEMA | Freq: Every day | RECTAL | Status: DC | PRN

## 2017-02-28 MED ORDER — WITCH HAZEL-GLYCERIN EX PADS: 1.0000 "application " | MEDICATED_PAD | CUTANEOUS | Status: DC | PRN

## 2017-02-28 MED ORDER — LACTATED RINGERS IV SOLN: 500.0000 mL | Freq: Once | INTRAVENOUS | Status: DC

## 2017-02-28 MED ORDER — METHADONE HCL 10 MG/ML PO CONC
75.0000 mg | Freq: Every day | ORAL | Status: DC
  Administered 2017-02-28 – 2017-03-02 (×3): 75 mg via ORAL
  Filled 2017-02-28 (×3): qty 7.5

## 2017-02-28 MED ORDER — LIDOCAINE HCL (PF) 1 % IJ SOLN
30.0000 mL | INTRAMUSCULAR | Status: AC | PRN
  Administered 2017-02-28: 3 mL via SUBCUTANEOUS

## 2017-02-28 MED ORDER — FENTANYL 2.5 MCG/ML W/ROPIVACAINE 0.15% IN NS 100 ML EPIDURAL (ARMC)
EPIDURAL | Status: DC | PRN
  Administered 2017-02-28: 12 mL/h via EPIDURAL

## 2017-02-28 MED ORDER — COCONUT OIL OIL
1.0000 "application " | TOPICAL_OIL | Status: DC | PRN
  Administered 2017-03-01: 1 via TOPICAL
  Filled 2017-02-28: qty 120

## 2017-02-28 MED ORDER — PHENYLEPHRINE 40 MCG/ML (10ML) SYRINGE FOR IV PUSH (FOR BLOOD PRESSURE SUPPORT)
80.0000 ug | PREFILLED_SYRINGE | INTRAVENOUS | Status: DC | PRN
  Filled 2017-02-28: qty 5

## 2017-02-28 MED ORDER — DIBUCAINE 1 % RE OINT: 1.0000 "application " | TOPICAL_OINTMENT | RECTAL | Status: DC | PRN

## 2017-02-28 MED ORDER — PRENATAL MULTIVITAMIN CH
1.0000 | ORAL_TABLET | Freq: Every day | ORAL | Status: DC
  Administered 2017-03-01 – 2017-03-02 (×2): 1 via ORAL
  Filled 2017-02-28 (×2): qty 1

## 2017-02-28 MED ORDER — SOD CITRATE-CITRIC ACID 500-334 MG/5ML PO SOLN: 30.0000 mL | ORAL | Status: DC | PRN

## 2017-02-28 MED ORDER — FENTANYL 2.5 MCG/ML W/ROPIVACAINE 0.15% IN NS 100 ML EPIDURAL (ARMC)
EPIDURAL | Status: AC
  Filled 2017-02-28: qty 100

## 2017-02-28 MED ORDER — ONDANSETRON HCL 4 MG PO TABS: 4.0000 mg | ORAL_TABLET | ORAL | Status: DC | PRN

## 2017-02-28 MED ORDER — ZOLPIDEM TARTRATE 5 MG PO TABS: 5.0000 mg | ORAL_TABLET | Freq: Every evening | ORAL | Status: DC | PRN

## 2017-02-28 NOTE — H&P (Signed)
Obstetric History and Physical  Amanda Barnett is a 21 y.o. G2P1001 with IUP at [redacted]w[redacted]d, dated by LMP, presenting with regular uterine contractions since 1000. Patient states she has been having none vaginal bleeding, intact membranes, with active fetal movement.    Denies difficulty breathing or respiratory distress, chest pain, and leg pain or swelling.  Prenatal Course  Source of Care: [facility], initial visit 11+6 weeks, total visits: 7  Pregnancy complications or risks: methadone use, depression, anxiety, history of gonorrhea  Prenatal labs and studies:  ABO, Rh: AB/Positive/-- (03/16 1018)  Antibody: Negative (03/16 1018)  Rubella: 3.81 (03/16 1018)  Varicella: 1511 (03/16 1018)  RPR: Non Reactive (03/16 1018)   HBsAg: Negative (03/16 1018)   HIV: Non Reactive (03/16 1018)  GBS: Positive (10/01 0010)  1 hr Glucola: 63 (08/16 1033)  Genetic screening: Normal (04/24 1047)  Anatomy US: Normal (06/14 0934)  Past Medical History:  Diagnosis Date  . Abdominal pain, recurrent   . Amenorrhea   . Asthma    as a child  . GERD (gastroesophageal reflux disease)   . Infantile colic    resolved    Past Surgical History:  Procedure Laterality Date  . CHOLECYSTECTOMY    . RHINOPLASTY N/A 01/07/2015   Procedure: RHINOPLASTY;  Surgeon: Vernie Murders, MD;  Location: [facility];  Service: ENT;  Laterality: N/A;  . RHINOPLASTY    . SEPTOPLASTY    . SEPTOPLASTY N/A 01/07/2015   Procedure: SEPTOPLASTY;  Surgeon: Vernie Murders, MD;  Location: [facility];  Service: ENT;  Laterality: N/A;  GAVE DISK TO [facility] 8-10 KP    OB History  Gravida Para Term Preterm AB Living  SAB TAB Ectopic Multiple Live Births          1    # Outcome Date GA Lbr Len/2nd Weight Sex Delivery Anes PTL Lv  2 Current           1 Term 08/30/13 [redacted]w[redacted]d  6 lb 11.2 oz (3.039 kg) M Vag-Spont  N LIV      Social History   Social History  . Marital status: Single    Spouse name:  N/A  . Number of children: N/A  . Years of education: N/A   Social History Main Topics  . Smoking status: Current Some Day Smoker    Packs/day: 0.25    Years: 2.00    Types: E-cigarettes  . Smokeless tobacco: Current User  . Alcohol use No  . Drug use: Yes    Types: Methadone  . Sexual activity: Yes    Partners: Male    Birth control/ protection: None   Other Topics Concern  . None   Social History Narrative       Family History  Problem Relation Age of Onset  . Pancreatitis Mother   . Hypertension Mother   . Migraines Mother   . Cholelithiasis Maternal Grandmother   . Rheum arthritis Maternal Grandmother   . Hypertension Maternal Grandmother     Prescriptions Prior to Admission  Medication Sig Dispense Refill Last Dose  . amoxicillin (AMOXIL) 500 MG capsule Take 1 capsule (500 mg total) by mouth 3 (three) times daily. 21 capsule 2   . buPROPion (WELLBUTRIN XL) 150 MG 24 hr tablet Take 1 tablet (150 mg total) by mouth daily. For three days.Then take 1 tablet (150 mg) twice a day (300 mg total) by mouth for four days. 11 tablet 0  02/26/2017 at Unknown time  . methadone (DOLOPHINE) 1 MG/1ML solution Take 75 mg by mouth daily.   02/26/2017 at Unknown time  . Prenat-FeFum-FePo-FA-Omega 3 (CONCEPT DHA) 53.5-38-1 MG CAPS Take 1 tablet by mouth daily. 30 capsule 10 02/26/2017 at Unknown time  . ranitidine (ZANTAC 75) 75 MG tablet Take 1 tablet (75 mg total) by mouth at bedtime. 30 tablet 8 02/26/2017 at Unknown time  . sertraline (ZOLOFT) 50 MG tablet Take 1 tablet (50 mg total) by mouth daily. 30 tablet 3 02/26/2017 at Unknown time    Allergies  Allergen Reactions  . Latex Itching    Review of Systems: Negative except for what is mentioned in HPI.  Physical Exam: BP 123/65 (BP Location: Left Arm)   Pulse 79   Temp 98.2 F (36.8 C) (Oral)   Resp 18   LMP 06/22/2016 (Approximate)   GENERAL: Well-developed, well-nourished female in no acute distress.   LUNGS: Clear to  auscultation bilaterally.   HEART: Regular rate and rhythm.  ABDOMEN: Soft, nontender, nondistended, gravid.  EXTREMITIES: Nontender, no edema, 2+ distal pulses.  Cervical Exam: Dilation: 6 Effacement (%): 90 Cervical Position: Posterior Station: 0 Exam by:: Shayne Alken, RN  FHT:  Baseline rate 130 bpm   Variability moderate  Accelerations present   Decelerations none  Contractions: Every three (3) to four (4) minutes, soft resting tone   Pertinent Labs/Studies:    Results for orders placed or performed during the hospital encounter of 02/28/17 (from the past 24 hour(s))  CBC     Status: None   Collection Time: 02/28/17  1:33 PM  Result Value Ref Range   WBC 10.1 3.6 - 11.0 K/uL   RBC 3.94 3.80 - 5.20 MIL/uL   Hemoglobin 12.4 12.0 - 16.0 g/dL   HCT 16.1 09.6 - 04.5 %   MCV 91.3 80.0 - 100.0 fL   MCH 31.4 26.0 - 34.0 pg   MCHC 34.4 32.0 - 36.0 g/dL   RDW 40.9 81.1 - 91.4 %   Platelets 228 150 - 440 K/uL    Assessment :  Amanda Barnett is a 21 y.o. 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   Labor: Expectant management.  Induction/Augmentation as needed, per protocol.  Delivery plan: Hopeful for vaginal delivery.  Dr. Valentino Saxon notified of admission.    Gunnar Bulla, CNM [facility], [facility]

## 2017-02-28 NOTE — Progress Notes (Signed)
MALISSIA RABBANI is a 21 y.o. G2P1001 at [redacted]w[redacted]d by LMP admitted for Preterm labor.  Subjective:  Pt resting quietly in bed on side, reports pain relief with epidural placement. Family members at bedside.   Denies difficulty breathing or respiratory distress, chest pain, abdominal pain, vaginal bleeding, leakage of fluid, and leg pain or swelling.  Objective:  Temp:  [98.2 F (36.8 C)] 98.2 F (36.8 C) (10/03 1307) Pulse Rate:  [79] 79 (10/03 1307) Resp:  [18] 18 (10/03 1307) BP: (123)/(65) 123/65 (10/03 1307)  FHT:  FHR: 130 bpm, variability: moderate,  accelerations:  Present,  decelerations:  Absent   UC:   regular, every two (2) to three (3) minutes, soft resting tone  SVE:   Dilation: 8 Effacement (%): 90 Station: +1 Exam by:: Delia Chimes, CNM   AROM: moderate amount, clear fluid  Labs: Lab Results  Component Value Date   WBC 10.1 02/28/2017   HGB 12.4 02/28/2017   HCT 36.0 02/28/2017   MCV 91.3 02/28/2017   PLT 228 02/28/2017    Assessment:  DANELIA SNODGRASS is a 21 y.o. 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   Continue orders as written. Reassess as needed.   Gunnar Bulla, CNM 02/28/2017, 3:25 PM

## 2017-02-28 NOTE — OB Triage Note (Signed)
Pt reports contractions starting about 11:00am after a shower and lasting about a minute or more, Denies vaginal bleeding or fluid leaking. Pain all in lower abdomen.

## 2017-02-28 NOTE — Anesthesia Preprocedure Evaluation (Signed)
Anesthesia Evaluation  Patient identified by MRN, date of birth, ID band Patient awake    Reviewed: H&P , NPO status , Patient's Chart, lab work & pertinent test results  Airway Mallampati: II  TM Distance: <3 FB Neck ROM: full    Dental no notable dental hx.    Pulmonary neg pulmonary ROS, Current Smoker,    Pulmonary exam normal        Cardiovascular negative cardio ROS Normal cardiovascular exam     Neuro/Psych negative neurological ROS  negative psych ROS   GI/Hepatic negative GI ROS, Neg liver ROS,   Endo/Other  negative endocrine ROS  Renal/GU negative Renal ROS  negative genitourinary   Musculoskeletal   Abdominal   Peds  Hematology negative hematology ROS (+)   Anesthesia Other Findings   Reproductive/Obstetrics (+) Pregnancy                             Anesthesia Physical Anesthesia Plan  ASA: II  Anesthesia Plan: Epidural   Post-op Pain Management:    Induction:   PONV Risk Score and Plan:   Airway Management Planned:   Additional Equipment:   Intra-op Plan:   Post-operative Plan:   Informed Consent: I have reviewed the patients History and Physical, chart, labs and discussed the procedure including the risks, benefits and alternatives for the proposed anesthesia with the patient or authorized representative who has indicated his/her understanding and acceptance.     Plan Discussed with: Anesthesiologist and CRNA  Anesthesia Plan Comments:         Anesthesia Quick Evaluation

## 2017-02-28 NOTE — Anesthesia Procedure Notes (Signed)
Epidural Patient location during procedure: OB Start time: 02/28/2017 2:22 PM End time: 02/28/2017 2:38 PM  Staffing Resident/CRNA: Junious Silk Performed: resident/CRNA   Preanesthetic Checklist Completed: patient identified, site marked, surgical consent, pre-op evaluation, timeout performed, IV checked, risks and benefits discussed and monitors and equipment checked  Epidural Patient position: sitting Prep: Betadine Patient monitoring: heart rate, continuous pulse ox and blood pressure Approach: midline Location: L3-L4 Injection technique: LOR saline  Needle:  Needle type: Tuohy  Needle gauge: 17 G Needle length: 9 cm and 9 Catheter type: closed end flexible Catheter size: 20 Guage Test dose: negative and 1.5% lidocaine with Epi 1:200 K  Assessment Sensory level: T10 Events: blood not aspirated, injection not painful, no injection resistance, negative IV test and no paresthesia  Additional Notes   Patient tolerated the insertion well without complications.Reason for block:procedure for pain

## 2017-03-01 LAB — URINE CULTURE: Culture: 40000 — AB

## 2017-03-01 LAB — TYPE AND SCREEN
ABO/RH(D): AB POS
ANTIBODY SCREEN: NEGATIVE

## 2017-03-01 LAB — CBC
HCT: 35.2 % (ref 35.0–47.0)
HEMOGLOBIN: 12.2 g/dL (ref 12.0–16.0)
MCH: 32 pg (ref 26.0–34.0)
MCHC: 34.7 g/dL (ref 32.0–36.0)
MCV: 92.1 fL (ref 80.0–100.0)
Platelets: 226 10*3/uL (ref 150–440)
RBC: 3.82 MIL/uL (ref 3.80–5.20)
RDW: 12.8 % (ref 11.5–14.5)
WBC: 7.9 10*3/uL (ref 3.6–11.0)

## 2017-03-01 LAB — RPR: RPR Ser Ql: NONREACTIVE

## 2017-03-01 MED ORDER — WHITE PETROLATUM GEL
Status: DC | PRN
  Filled 2017-03-01: qty 5
  Filled 2017-03-01: qty 20

## 2017-03-01 NOTE — Anesthesia Postprocedure Evaluation (Signed)
Anesthesia Post Note  Patient: Amanda Barnett  Procedure(s) Performed: AN AD HOC LABOR EPIDURAL  Patient location during evaluation: Women's Unit Anesthesia Type: Epidural Level of consciousness: awake, awake and alert and oriented Pain management: pain level controlled Vital Signs Assessment: post-procedure vital signs reviewed and stable Respiratory status: spontaneous breathing, nonlabored ventilation and respiratory function stable Cardiovascular status: stable Postop Assessment: no headache, no backache, adequate PO intake, no apparent nausea or vomiting and patient able to bend at knees Anesthetic complications: no     Last Vitals:  Vitals:   03/01/17 0450 03/01/17 0717  BP: (!) 108/58 115/65  Pulse: 70 63  Resp: 18 18  Temp: 36.8 C 36.6 C  SpO2: 99% 99%    Last Pain:  Vitals:   03/01/17 0717  TempSrc: Oral  PainSc:                  Lyn Records

## 2017-03-01 NOTE — Progress Notes (Signed)
Post Partum Day 1   Subjective:  Pt resting quietly in bed, no questions or concerns. Family members at bedside.   Denies difficulty breathing or respiratory distress, chest pain, abdominal pain, excessive vaginal bleeding, and leg pain or swelling.   Objective:  Temp:  [97.8 F (36.6 C)-98.4 F (36.9 C)] 98 F (36.7 C) (10/04 1612) Pulse Rate:  [63-84] 71 (10/04 1612) Resp:  [18] 18 (10/04 1612) BP: (108-130)/(58-78) 114/78 (10/04 1612) SpO2:  [99 %-100 %] 99 % (10/04 0717) Weight:  [161 lb (73 kg)] 161 lb (73 kg) (10/03 2118)  Physical Exam:   General: alert and cooperative   Lungs - Normal respiratory effort, chest expands symmetrically. Lungs are clear to auscultation, no crackles or wheezes.  CVS exam: normal rate, regular rhythm, normal S1, S2, no murmurs, rubs, clicks or gallops.   Lochia: appropriate  Uterine Fundus: firm  Laceration: healing well  DVT Evaluation: No evidence of DVT seen on physical exam. Negative Homan's sign.   Recent Labs  02/28/17 1333 03/01/17 0614  HGB 12.4 12.2  HCT 36.0 35.2    Assessment:  Status post vaginal birth  Rh positive  Methadone use  Smoker  Breast and bottle feeding   Plan:  Continue orders as written. Reassess as needed.   Plan for discharge tomorrow   LOS: 1 day   Gunnar Bulla, CNM 03/01/2017, 6:45 PM

## 2017-03-01 NOTE — Lactation Note (Signed)
This note was copied from a baby's chart. Lactation Consultation Note  Patient Name: Amanda Barnett PIRJJ'O Date: 03/01/2017     Maternal Data   Mother is on Methadone po 75 mg. She stated she is  both breast and bottle feeding her baby. She told me she is bottle feeding the baby formula here and has pumped twice. She plans on starting breast feding when she gets discharged. I have encouraged her to try while she is in the hospital and we will help her. She can try breast feeding at the beginning of each feed and then complete the feeding with a bottle of formula. She has stated that she thinks the baby to too hungry to breast feed and needs the formula as well as her milk. She pumped during the night but has not pumped today.   Feeding Feeding Type: Bottle Fed - Formula Nipple Type: Slow - flow                       Gilman Schmidt Keiandre Cygan 03/01/2017, 2:52 PM

## 2017-03-02 MED ORDER — SENNOSIDES-DOCUSATE SODIUM 8.6-50 MG PO TABS: 2.0000 | ORAL_TABLET | ORAL | 0 refills | Status: DC

## 2017-03-02 MED ORDER — NORETHINDRONE 0.35 MG PO TABS: 1.0000 | ORAL_TABLET | Freq: Every day | ORAL | 11 refills | Status: DC

## 2017-03-02 MED ORDER — IBUPROFEN 600 MG PO TABS: 600.0000 mg | ORAL_TABLET | Freq: Four times a day (QID) | ORAL | 0 refills | Status: DC

## 2017-03-02 NOTE — Progress Notes (Signed)
Discharge order received from doctor. Copy of MAR given to patient for clinic prior to discharge. Reviewed discharge instructions and prescriptions with patient and answered all questions. Follow up appointment instructions given. Patient verbalized understanding. ID bands checked. Patient discharged (without infant, infant in SCN). However, patient still in hospital due to her rooming in with her infant who is in SCN.    Oswald Hillock, RN

## 2017-03-02 NOTE — Final Progress Note (Signed)
Discharge Day SOAP Note:  Progress Note - Vaginal Delivery  Amanda Barnett is a 21 y.o. G2P1101 now PP day 2 s/p Vaginal, Spontaneous Delivery . Delivery was complicated by preterm labor   Subjective  The patient has the following complaints: has no unusual complaints  Pain is controlled with current medications.   Patient is urinating without difficulty.  She is ambulating well.    Objective  Vital signs: BP 112/64 (BP Location: Left Arm)   Pulse 67   Temp 98.7 F (37.1 C) (Oral)   Resp 18   Ht  (1.549 m)   Wt 161 lb (73 kg)   LMP 06/22/2016 (Approximate)   SpO2 98%   Breastfeeding? Unknown   BMI 30.42 kg/m   Physical Exam: Gen: NAD Fundus Fundal Tone: Firm  Lochia Amount: Small  Perineum Appearance: Intact, Edematous                Data Review Labs: CBC Latest Ref Rng & Units 03/01/2017 02/28/2017 01/11/2017  WBC 3.6 - 11.0 K/uL 7.9 10.1 6.8  Hemoglobin 12.0 - 16.0 g/dL 40.9 81.1 91.4  Hematocrit 35.0 - 47.0 % 35.2 36.0 36.3  Platelets 150 - 440 K/uL 226 228 223   AB POS  Assessment/Plan  Active Problems:   Indication for care in labor and delivery, antepartum    Plan for discharge today.   Discharge Instructions: Per After Visit Summary. Activity: Advance as tolerated. Pelvic rest for 6 weeks.  Also refer to After Visit Summary Diet: Regular Medications:     Allergies as of 03/02/2017      Reactions   Latex Itching              Medication List     STOP taking these medications   amoxicillin 500 MG capsule Commonly known as:  AMOXIL   ranitidine 75 MG tablet Commonly known as:  ZANTAC 75     TAKE these medications   buPROPion 150 MG 24 hr tablet Commonly known as:  WELLBUTRIN XL Take 1 tablet (150 mg total) by mouth daily. For three days.Then take 1 tablet (150 mg) twice a day (300 mg total) by mouth for four days.   CONCEPT DHA 53.5-38-1 MG Caps Take 1 tablet by mouth daily.   ibuprofen 600 MG  tablet Commonly known as:  ADVIL,MOTRIN Take 1 tablet (600 mg total) by mouth every 6 (six) hours.   methadone 1 MG/1ML solution Commonly known as:  DOLOPHINE Take 75 mg by mouth daily.   norethindrone 0.35 MG tablet Commonly known as:  MICRONOR,CAMILA,ERRIN Take 1 tablet (0.35 mg total) by mouth daily.   senna-docusate 8.6-50 MG tablet Commonly known as:  Senokot-S Take 2 tablets by mouth daily.   sertraline 50 MG tablet Commonly known as:  ZOLOFT Take 1 tablet (50 mg total) by mouth daily.      Outpatient follow up:  Postpartum contraception: oral progesterone-only contraceptive, start at 4 wks PP.   Discharged Condition: good  Discharged to: home or room in with baby  Newborn Data: Disposition:continued stay at hospital   Apgars: APGAR (1 MIN): 9   APGAR (5 MINS): 9   APGAR (10 MINS):    Baby Feeding: Breast    Doreene Burke, CNM 03/02/2017 8:18 AM

## 2017-03-02 NOTE — Discharge Instructions (Signed)
Please call your doctor or return to the ER if you experience any chest pains, shortness of breath, dizziness, visual changes, fever greater than 101, any heavy bleeding (saturating more than 1 pad per hour), large clots, or foul smelling discharge, any worsening abdominal pain and cramping that is not controlled by pain medication, or any signs of postpartum depression. No tampons, enemas, douches, or sexual intercourse for 6 weeks. Also avoid tub baths, hot tubs, or swimming for 6 weeks.  °

## 2017-03-02 NOTE — Discharge Summary (Signed)
Discharge Summary  Date of Admission: 02/28/2017  Date of Discharge: 03/02/2017  Admitting Diagnosis: Onset of Labor at [redacted]w[redacted]d  Secondary Diagnosis: Preterm Labor and Delivery  Mode of Delivery: normal spontaneous vaginal delivery  Discharge Diagnosis: No other diagnosis   Intrapartum Procedures: Atificial rupture of membranes and epidural   Post partum procedures: none  Complications: bilateral periurethral laceration                      Discharge Day SOAP Note:  Progress Note - Vaginal Delivery  Amanda Barnett is a 21 y.o. G2P1101 now PP day 2 s/p Vaginal, Spontaneous Delivery . Delivery was complicated by preterm labor   Subjective  The patient has the following complaints: has no unusual complaints  Pain is controlled with current medications.   Patient is urinating without difficulty.  She is ambulating well.    Objective  Vital signs: BP 112/64 (BP Location: Left Arm)   Pulse 67   Temp 98.7 F (37.1 C) (Oral)   Resp 18   Ht  (1.549 m)   Wt 161 lb (73 kg)   LMP 06/22/2016 (Approximate)   SpO2 98%   Breastfeeding? Unknown   BMI 30.42 kg/m   Physical Exam: Gen: NAD Fundus Fundal Tone: Firm  Lochia Amount: Small  Perineum Appearance: Intact, Edematous     Data Review Labs: CBC Latest Ref Rng & Units 03/01/2017 02/28/2017 01/11/2017  WBC 3.6 - 11.0 K/uL 7.9 10.1 6.8  Hemoglobin 12.0 - 16.0 g/dL 16.1 09.6 04.5  Hematocrit 35.0 - 47.0 % 35.2 36.0 36.3  Platelets 150 - 440 K/uL 226 228 223   AB POS  Assessment/Plan  Active Problems:   Indication for care in labor and delivery, antepartum    Plan for discharge today.   Discharge Instructions: Per After Visit Summary. Activity: Advance as tolerated. Pelvic rest for 6 weeks.  Also refer to After Visit Summary Diet: Regular Medications: Allergies as of 03/02/2017      Reactions   Latex Itching      Medication List    STOP taking these medications    amoxicillin 500 MG capsule Commonly known as:  AMOXIL   ranitidine 75 MG tablet Commonly known as:  ZANTAC 75     TAKE these medications   buPROPion 150 MG 24 hr tablet Commonly known as:  WELLBUTRIN XL Take 1 tablet (150 mg total) by mouth daily. For three days.Then take 1 tablet (150 mg) twice a day (300 mg total) by mouth for four days.   CONCEPT DHA 53.5-38-1 MG Caps Take 1 tablet by mouth daily.   ibuprofen 600 MG tablet Commonly known as:  ADVIL,MOTRIN Take 1 tablet (600 mg total) by mouth every 6 (six) hours.   methadone 1 MG/1ML solution Commonly known as:  DOLOPHINE Take 75 mg by mouth daily.   norethindrone 0.35 MG tablet Commonly known as:  MICRONOR,CAMILA,ERRIN Take 1 tablet (0.35 mg total) by mouth daily.   senna-docusate 8.6-50 MG tablet Commonly known as:  Senokot-S Take 2 tablets by mouth daily.   sertraline 50 MG tablet Commonly known as:  ZOLOFT Take 1 tablet (50 mg total) by mouth daily.      Outpatient follow up:  Postpartum contraception: oral progesterone-only contraceptive, start at 4 wks PP.   Discharged Condition: good  Discharged to:  home or room in with baby  Newborn Data: Disposition:continued stay at hospital   Apgars: APGAR (1 MIN): 9   APGAR (5 MINS): 9   APGAR (10 MINS):    Baby Feeding: Breast    Doreene Burke, CNM 03/02/2017 8:18 AM

## 2017-03-05 ENCOUNTER — Encounter: Payer: Medicaid Other | Admitting: Certified Nurse Midwife

## 2017-03-05 ENCOUNTER — Other Ambulatory Visit: Payer: Medicaid Other

## 2019-03-12 ENCOUNTER — Encounter: Payer: Self-pay | Admitting: Certified Nurse Midwife

## 2019-03-12 ENCOUNTER — Ambulatory Visit (INDEPENDENT_AMBULATORY_CARE_PROVIDER_SITE_OTHER): Payer: Medicaid Other | Admitting: Certified Nurse Midwife

## 2019-03-12 ENCOUNTER — Other Ambulatory Visit: Payer: Self-pay

## 2019-03-12 ENCOUNTER — Other Ambulatory Visit (HOSPITAL_COMMUNITY)
Admission: RE | Admit: 2019-03-12 | Discharge: 2019-03-12 | Disposition: A | Discharge status: Home / Self Care | Payer: Medicaid Other | Source: Ambulatory Visit | Attending: Certified Nurse Midwife | Admitting: Certified Nurse Midwife

## 2019-03-12 VITALS — BP 110/77 | HR 65 | Ht 61.0 in | Wt 185.2 lb

## 2019-03-12 DIAGNOSIS — N939 Abnormal uterine and vaginal bleeding, unspecified: Secondary | ICD-10-CM

## 2019-03-12 DIAGNOSIS — Z8742 Personal history of other diseases of the female genital tract: Secondary | ICD-10-CM

## 2019-03-12 DIAGNOSIS — R5383 Other fatigue: Secondary | ICD-10-CM | POA: Diagnosis not present

## 2019-03-12 NOTE — Progress Notes (Signed)
GYN ENCOUNTER NOTE  Subjective:       Amanda Barnett is a 23 y.o. G3P1101 female is here for gynecologic evaluation of the following issues:  1. heavy bleeding x 3 wks. She denies pain , new sexual partners. She does have fatigue.  She has a nexplanon. She states that her bleeding was kind of irregular initially then settled out and now has been heavy x 3 wks.    Gynecologic History No LMP recorded. Contraception: Nexplanon Last Pap: 09/13/2016. Results were: abnormal, LSIL  Last mammogram: n/a.   Obstetric History OB History  Gravida Para Term Preterm AB Living  2 2 1 1   1   SAB TAB Ectopic Multiple Live Births        0 1    # Outcome Date GA Lbr Len/2nd Weight Sex Delivery Anes PTL Lv  2 Preterm 02/28/17 [redacted]w[redacted]d 05:21 / 00:45 5 lb 12.1 oz (2.61 kg) M Vag-Spont EPI  LIV  1 Term 08/30/13 [redacted]w[redacted]d  6 lb 11.2 oz (3.039 kg) M Vag-Spont  N LIV    Past Medical History:  Diagnosis Date  . Abdominal pain, recurrent   . Amenorrhea   . Asthma    as a child  . GERD (gastroesophageal reflux disease)   . Infantile colic    resolved    Past Surgical History:  Procedure Laterality Date  . CHOLECYSTECTOMY    . RHINOPLASTY N/A 01/07/2015   Procedure: RHINOPLASTY;  Surgeon: 03/09/2015, MD;  Location: [facility];  Service: ENT;  Laterality: N/A;  . RHINOPLASTY    . SEPTOPLASTY    . SEPTOPLASTY N/A 01/07/2015   Procedure: SEPTOPLASTY;  Surgeon: 03/09/2015, MD;  Location: [facility];  Service: ENT;  Laterality: N/A;  GAVE DISK TO [facility] 8-10 KP    Current Outpatient Medications on File Prior to Visit  Medication Sig Dispense Refill  . buPROPion (WELLBUTRIN XL) 150 MG 24 hr tablet Take 1 tablet (150 mg total) by mouth daily. For three days.Then take 1 tablet (150 mg) twice a day (300 mg total) by mouth for four days. 11 tablet 0  . ibuprofen (ADVIL,MOTRIN) 600 MG tablet Take 1 tablet (600 mg total) by mouth every 6 (six) hours. 30 tablet 0  . methadone (DOLOPHINE) 1  MG/1ML solution Take 75 mg by mouth daily.    . norethindrone (MICRONOR,CAMILA,ERRIN) 0.35 MG tablet Take 1 tablet (0.35 mg total) by mouth daily. 1 Package 11  . Prenat-FeFum-FePo-FA-Omega 3 (CONCEPT DHA) 53.5-38-1 MG CAPS Take 1 tablet by mouth daily. 30 capsule 10  . senna-docusate (SENOKOT-S) 8.6-50 MG tablet Take 2 tablets by mouth daily. 30 tablet 0  . sertraline (ZOLOFT) 50 MG tablet Take 1 tablet (50 mg total) by mouth daily. 30 tablet 3   No current facility-administered medications on file prior to visit.     Allergies  Allergen Reactions  . Latex Itching    Social History   Socioeconomic History  . Marital status: Single    Spouse name: Not on file  . Number of children: Not on file  . Years of education: Not on file  . Highest education level: Not on file  Occupational History  . Not on file  Social Needs  . Financial resource strain: Not on file  . Food insecurity    Worry: Not on file    Inability: Not on file  . Transportation needs    Medical: Not on file    Non-medical: Not on file  Tobacco  Use  . Smoking status: Current Some Day Smoker    Packs/day: 0.25    Years: 2.00    Pack years: 0.50    Types: E-cigarettes  . Smokeless tobacco: Current User  Substance and Sexual Activity  . Alcohol use: No  . Drug use: Yes    Types: Marijuana    Comment: METHADONE x4-5 MONTHS  . Sexual activity: Yes    Partners: Male    Birth control/protection: None  Lifestyle  . Physical activity    Days per week: Not on file    Minutes per session: Not on file  . Stress: Not on file  Relationships  . Social Musicianconnections    Talks on phone: Not on file    Gets together: Not on file    Attends religious service: Not on file    Active member of club or organization: Not on file    Attends meetings of clubs or organizations: Not on file    Relationship status: Not on file  . Intimate partner violence    Fear of current or ex partner: Not on file    Emotionally abused:  Not on file    Physically abused: Not on file    Forced sexual activity: Not on file  Other Topics Concern  . Not on file  Social History Narrative   11th grade    Family History  Problem Relation Age of Onset  . Pancreatitis Mother   . Hypertension Mother   . Migraines Mother   . Cholelithiasis Maternal Grandmother   . Rheum arthritis Maternal Grandmother   . Hypertension Maternal Grandmother     The following portions of the patient's history were reviewed and updated as appropriate: allergies, current medications, past family history, past medical history, past social history, past surgical history and problem list.  Review of Systems Review of Systems - Negative except as mentioned in HPI Review of Systems - General ROS: negative for - chills, fatigue, fever, hot flashes, malaise or night sweats Hematological and Lymphatic ROS: negative for - bleeding problems or swollen lymph nodes Gastrointestinal ROS: negative for - abdominal pain, blood in stools, change in bowel habits and nausea/vomiting Musculoskeletal ROS: negative for - joint pain, muscle pain or muscular weakness Genito-Urinary ROS: negative for - change in menstrual cycle, dysmenorrhea, dyspareunia, dysuria, genital discharge, genital ulcers, hematuria, incontinence, irregular/heavy menses, nocturia or pelvic painjj  Objective:   There were no vitals taken for this visit. CONSTITUTIONAL: Well-developed, well-nourished female in no acute distress.  HENT:  Normocephalic, atraumatic.  NECK: Normal range of motion, supple, no masses.  Normal thyroid.  SKIN: Skin is warm and dry. No rash noted. Not diaphoretic. No erythema. No pallor. NEUROLGIC: Alert and oriented to person, place, and time. PSYCHIATRIC: Normal mood and affect. Normal behavior. Normal judgment and thought content. CARDIOVASCULAR:Not Examined RESPIRATORY: Not Examined BREASTS: Not Examined ABDOMEN: Soft, non distended; Non tender.  No  Organomegaly. PELVIC:  External Genitalia: Normal  BUS: Normal  Vagina: , blood noted   Cervix: Normalr MUSCULOSKELETAL: Normal range of motion. No tenderness.  No cyanosis, clubbing, or edema.  Assessment:   Abnormal Uterine Bleeding  Plan:   Pap collected due to history abnormal 2018. U/s ordered for pelvic. Discussed likely cause of nexplanon. Discussed options of NSAID x 1 wk and or OCPs x 3 months or lysteda . She request to do motrin wait for pap results and u/s. She will follow up if bleeding does not improve. Return u/s or PRN. Will follow  up with results.   Philip Aspen, CNM

## 2019-03-12 NOTE — Patient Instructions (Signed)
Abnormal Uterine Bleeding °Abnormal uterine bleeding means bleeding more than usual from your uterus. It can include: °· Bleeding between periods. °· Bleeding after sex. °· Bleeding that is heavier than normal. °· Periods that last longer than usual. °· Bleeding after you have stopped having your period (menopause). °There are many problems that may cause this. You should see a doctor for any kind of bleeding that is not normal. Treatment depends on the cause of the bleeding. °Follow these instructions at home: °· Watch your condition for any changes. °· Do not use tampons, douche, or have sex, if your doctor tells you not to. °· Change your pads often. °· Get regular well-woman exams. Make sure they include a pelvic exam and cervical cancer screening. °· Keep all follow-up visits as told by your doctor. This is important. °Contact a doctor if: °· The bleeding lasts more than one week. °· You feel dizzy at times. °· You feel like you are going to throw up (nauseous). °· You throw up. °Get help right away if: °· You pass out. °· You have to change pads every hour. °· You have belly (abdominal) pain. °· You have a fever. °· You get sweaty. °· You get weak. °· You passing large blood clots from your vagina. °Summary °· Abnormal uterine bleeding means bleeding more than usual from your uterus. °· There are many problems that may cause this. You should see a doctor for any kind of bleeding that is not normal. °· Treatment depends on the cause of the bleeding. °This information is not intended to replace advice given to you by your health care provider. Make sure you discuss any questions you have with your health care provider. °Document Released: 03/12/2009 Document Revised: 05/09/2016 Document Reviewed: 05/09/2016 °Elsevier Patient Education © 2020 Elsevier Inc. ° °

## 2019-03-13 LAB — CBC
Hematocrit: 41.1 % (ref 34.0–46.6)
Hemoglobin: 13.8 g/dL (ref 11.1–15.9)
MCH: 30.6 pg (ref 26.6–33.0)
MCHC: 33.6 g/dL (ref 31.5–35.7)
MCV: 91 fL (ref 79–97)
Platelets: 294 10*3/uL (ref 150–450)
RBC: 4.51 x10E6/uL (ref 3.77–5.28)
RDW: 12.2 % (ref 11.7–15.4)
WBC: 7.1 10*3/uL (ref 3.4–10.8)

## 2019-03-13 LAB — FERRITIN: Ferritin: 80 ng/mL (ref 15–150)

## 2019-03-14 LAB — CYTOLOGY - PAP: Diagnosis: NEGATIVE

## 2019-03-19 ENCOUNTER — Ambulatory Visit (INDEPENDENT_AMBULATORY_CARE_PROVIDER_SITE_OTHER): Payer: Medicaid Other

## 2019-03-19 ENCOUNTER — Other Ambulatory Visit: Payer: Self-pay

## 2019-03-19 DIAGNOSIS — N939 Abnormal uterine and vaginal bleeding, unspecified: Secondary | ICD-10-CM

## 2019-03-19 DIAGNOSIS — N83201 Unspecified ovarian cyst, right side: Secondary | ICD-10-CM | POA: Diagnosis not present

## 2019-09-27 ENCOUNTER — Ambulatory Visit: Payer: Medicaid Other | Attending: Internal Medicine

## 2019-09-27 ENCOUNTER — Other Ambulatory Visit: Payer: Self-pay

## 2019-09-27 DIAGNOSIS — Z23 Encounter for immunization: Secondary | ICD-10-CM

## 2019-09-27 NOTE — Progress Notes (Signed)
   Covid-19 Vaccination Clinic  Name:  Amanda Barnett    MRN: [id number] DOB: January 27, 1996  09/27/2019  [name] was observed post Covid-19 immunization for 15 minutes without incident. She was provided with Vaccine Information Sheet and instruction to access the V-Safe system.   [name] was instructed to call 911 with any severe reactions post vaccine: Marland Kitchen Difficulty breathing  . Swelling of face and throat  . A fast heartbeat  . A bad rash all over body  . Dizziness and weakness   Immunizations Administered    Name Date Dose VIS Date Route   Pfizer COVID-19 Vaccine 09/27/2019  8:29 AM 0.3 mL 07/23/2018 Intramuscular   Manufacturer: [employer], [employer]   Lot: [id number]   NDC: [address]-5

## 2019-10-21 ENCOUNTER — Ambulatory Visit: Payer: Medicaid Other | Attending: Internal Medicine

## 2019-10-21 DIAGNOSIS — Z23 Encounter for immunization: Secondary | ICD-10-CM

## 2019-10-21 NOTE — Progress Notes (Signed)
   Covid-19 Vaccination Clinic  Name:  Amanda Barnett    MRN: [id number] DOB: 1995/10/04  10/21/2019  [name] was observed post Covid-19 immunization for 15 minutes without incident. She was provided with Vaccine Information Sheet and instruction to access the V-Safe system.   [name] was instructed to call 911 with any severe reactions post vaccine: Marland Kitchen Difficulty breathing  . Swelling of face and throat  . A fast heartbeat  . A bad rash all over body  . Dizziness and weakness   Immunizations Administered    Name Date Dose VIS Date Route   Pfizer COVID-19 Vaccine 10/21/2019  2:04 PM 0.3 mL 07/23/2018 Intramuscular   Manufacturer: [employer], [employer]   Lot: [id number]   NDC: [address]-7

## 2020-01-22 ENCOUNTER — Ambulatory Visit: Payer: Medicaid Other | Admitting: Physician Assistant

## 2020-01-22 ENCOUNTER — Other Ambulatory Visit: Payer: Self-pay

## 2020-01-22 ENCOUNTER — Encounter: Payer: Self-pay | Admitting: Physician Assistant

## 2020-01-22 DIAGNOSIS — Z202 Contact with and (suspected) exposure to infections with a predominantly sexual mode of transmission: Secondary | ICD-10-CM

## 2020-01-22 NOTE — Progress Notes (Signed)
Pt here STD screen. Reports partner is + for chlamydia and gonorrhea. Sharlyne Pacas, RN  Pt left before seeing provider. States she has to pick up children from bus stop, but will reschedule for STD screen. Sharlyne Pacas, RN

## 2020-01-29 ENCOUNTER — Other Ambulatory Visit: Payer: Self-pay

## 2020-01-29 ENCOUNTER — Ambulatory Visit: Payer: Medicaid Other | Admitting: Physician Assistant

## 2020-01-29 ENCOUNTER — Encounter: Payer: Self-pay | Admitting: Physician Assistant

## 2020-01-29 DIAGNOSIS — Z202 Contact with and (suspected) exposure to infections with a predominantly sexual mode of transmission: Secondary | ICD-10-CM

## 2020-01-29 DIAGNOSIS — Z113 Encounter for screening for infections with a predominantly sexual mode of transmission: Secondary | ICD-10-CM | POA: Diagnosis not present

## 2020-01-29 MED ORDER — DOXYCYCLINE HYCLATE 100 MG PO TABS: 100.0000 mg | ORAL_TABLET | Freq: Two times a day (BID) | ORAL | 0 refills | Status: AC

## 2020-01-29 MED ORDER — CEFTRIAXONE SODIUM 500 MG IJ SOLR
500.0000 mg | Freq: Once | INTRAMUSCULAR | Status: AC
  Administered 2020-01-29: 500 mg via INTRAMUSCULAR

## 2020-01-29 NOTE — Progress Notes (Signed)
Patient treated per provider orders, counseled to wait in clinic for 20 minutes for observation after ceftriaxone admin. Given doxycycline x 7 days and counseled how to take. Patient agrees to wait in clinic for observation.Burt Knack, RN

## 2020-01-29 NOTE — Progress Notes (Signed)
Pt is here as she found out what her partner had tested positive for and is here for treatment.

## 2020-01-30 ENCOUNTER — Encounter: Payer: Self-pay | Admitting: Physician Assistant

## 2020-01-30 NOTE — Progress Notes (Signed)
  [facility] Department STI clinic/screening visit  Subjective:  STARLENE CONSUEGRA is a 24 y.o. female being seen today for an STI screening visit. The patient reports they do not have symptoms.  Patient reports that they do not desire a pregnancy in the next year.   They reported they are not interested in discussing contraception today.  Patient's last menstrual period was 12/30/2019 (within days).   Patient has the following medical conditions:   Patient Active Problem List   Diagnosis Date Noted  . Abnormal uterine bleeding 03/12/2019  . 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 Anxiety 07/03/2014  . Depression 07/03/2014  . Nausea & vomiting 04/07/2011    Chief Complaint  Patient presents with  . SEXUALLY TRANSMITTED DISEASE    screening    HPI  Patient reports that she is not having symptoms but is a contact to GC and Chlamydia.  States that she has a Nexplanon as her BCM.  Reports that her last HIV test was 3 months ago and last pap 1 year ago.   See flowsheet for further details and programmatic requirements.    The following portions of the patient's history were reviewed and updated as appropriate: allergies, current medications, past medical history, past social history, past surgical history and problem list.  Objective:  There were no vitals filed for this visit.  Physical Exam Constitutional:      General: She is not in acute distress.    Appearance: Normal appearance.  HENT:     Head: Normocephalic and atraumatic.  Eyes:     Conjunctiva/sclera: Conjunctivae normal.  Pulmonary:     Effort: Pulmonary effort is normal.  Neurological:     Mental Status: She is alert and oriented to person, place, and time.  Psychiatric:        Mood and Affect: Mood normal.        Behavior: Behavior normal.        Thought Content: Thought content normal.        Judgment: Judgment normal.      Assessment and Plan:   COLLEENE SWARTHOUT is a 24 y.o. female presenting to the [facility] Department for STI screening  1. Screening for STD (sexually transmitted disease) Patient into clinic without symptoms. Patient declines screening exam and blood work today.  Requests treatment as a contact only. Rec condoms with all sex.  2. Venereal disease contact Treat as a contact to GC and Chlamydia with Ceftriaxone 500 mg IM and Doxycycline 100 mg #14 1 po BID for 7 days. No sex for  7 days and until after partner completes treatment. RTC prn and call with questions or concerns. - cefTRIAXone (ROCEPHIN) injection 500 mg - doxycycline (VIBRA-TABS) 100 MG tablet; Take 1 tablet (100 mg total) by mouth 2 (two) times daily for 7 days.  Dispense: 14 tablet; Refill: 0     No follow-ups on file.  No future appointments.  Matt Holmes, PA

## 2020-06-29 ENCOUNTER — Ambulatory Visit: Payer: Medicaid Other

## 2020-11-17 ENCOUNTER — Emergency Department
Admission: EM | Admit: 2020-11-17 | Discharge: 2020-11-17 | Disposition: A | Discharge status: Home / Self Care | Payer: Medicaid Other | Attending: Emergency Medicine | Admitting: Emergency Medicine

## 2020-11-17 ENCOUNTER — Other Ambulatory Visit: Payer: Self-pay

## 2020-11-17 ENCOUNTER — Encounter: Payer: Self-pay | Admitting: Emergency Medicine

## 2020-11-17 ENCOUNTER — Emergency Department: Payer: Medicaid Other

## 2020-11-17 DIAGNOSIS — W228XXA Striking against or struck by other objects, initial encounter: Secondary | ICD-10-CM | POA: Diagnosis not present

## 2020-11-17 DIAGNOSIS — S0181XA Laceration without foreign body of other part of head, initial encounter: Secondary | ICD-10-CM | POA: Diagnosis not present

## 2020-11-17 DIAGNOSIS — J45909 Unspecified asthma, uncomplicated: Secondary | ICD-10-CM | POA: Diagnosis not present

## 2020-11-17 DIAGNOSIS — F17210 Nicotine dependence, cigarettes, uncomplicated: Secondary | ICD-10-CM | POA: Insufficient documentation

## 2020-11-17 DIAGNOSIS — S0990XA Unspecified injury of head, initial encounter: Secondary | ICD-10-CM | POA: Diagnosis present

## 2020-11-17 DIAGNOSIS — S0083XA Contusion of other part of head, initial encounter: Secondary | ICD-10-CM

## 2020-11-17 NOTE — ED Notes (Signed)
Pt in NAD. No complaints or questions at d/c. Alert and oriented.
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
## 2020-11-17 NOTE — Discharge Instructions (Addendum)
No acute findings on CT of the head.  Read and follow discharge care instructions.  Advise over-the-counter Tylenol ibuprofen as needed for headache/pain.
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
## 2021-02-15 ENCOUNTER — Encounter: Payer: Medicaid Other | Admitting: Certified Nurse Midwife

## 2021-02-15 ENCOUNTER — Other Ambulatory Visit: Payer: Self-pay

## 2021-02-15 DIAGNOSIS — Z3200 Encounter for pregnancy test, result unknown: Secondary | ICD-10-CM

## 2021-02-16 ENCOUNTER — Encounter: Payer: Self-pay | Admitting: Certified Nurse Midwife

## 2021-02-16 ENCOUNTER — Other Ambulatory Visit: Payer: Self-pay

## 2021-02-16 ENCOUNTER — Ambulatory Visit (INDEPENDENT_AMBULATORY_CARE_PROVIDER_SITE_OTHER): Payer: Medicaid Other | Admitting: Certified Nurse Midwife

## 2021-02-16 VITALS — BP 114/77 | HR 94 | Ht 61.0 in | Wt 183.0 lb

## 2021-02-16 DIAGNOSIS — Z3200 Encounter for pregnancy test, result unknown: Secondary | ICD-10-CM | POA: Diagnosis not present

## 2021-02-16 LAB — POCT URINE PREGNANCY: Preg Test, Ur: POSITIVE — AB

## 2021-02-16 MED ORDER — DOXYLAMINE-PYRIDOXINE 10-10 MG PO TBEC: 1.0000 | DELAYED_RELEASE_TABLET | Freq: Four times a day (QID) | ORAL | 5 refills | Status: DC

## 2021-02-16 NOTE — Progress Notes (Signed)
Subjective:    Amanda Barnett is a 25 y.o. female who presents for evaluation of amenorrhea. She believes she could be pregnant. Pregnancy is desired. Sexual Activity: single partner, contraception: none. Current symptoms also include: fatigue and nausea. Last period was unsure of LMP.   Patient's last menstrual period was 11/28/2020. The following portions of the patient's history were reviewed and updated as appropriate: allergies, current medications, past family history, past medical history, past social history, past surgical history, and problem list.  Review of Systems Pertinent items are noted in HPI.     Objective:    BP 114/77   Pulse 94   Ht 5\' 1"  (1.549 m)   Wt 183 lb (83 kg)   LMP 11/28/2020   BMI 34.58 kg/m  General: alert, cooperative, appears stated age, and no acute distress    Lab Review Urine HCG: positive    Assessment:    Absence of menstruation.     Plan:    Pregnancy Test:  Positive: EDC: unsure of LMP thinks 7/3 [facility] 09/04/21. Briefly discussed pre-natal care options. MD and midwife services reviewed. Plans to see midwife. Encouraged well-balanced diet, plenty of rest when needed, pre-natal vitamins daily and walking for exercise. Discussed self-help for nausea, avoiding OTC medications until consulting provider or pharmacist, other than Tylenol as needed, minimal caffeine (1-2 cups daily) and avoiding alcohol. She will schedule u/s for dating as soon as possible, nurse visit 1 wk and her initial NOB visit in 1-2 wks. Feel free to call with any questions. Orders for diclegis sent .   11/04/21, CNM

## 2021-02-21 ENCOUNTER — Other Ambulatory Visit: Payer: Self-pay

## 2021-02-21 ENCOUNTER — Ambulatory Visit (INDEPENDENT_AMBULATORY_CARE_PROVIDER_SITE_OTHER): Payer: Medicaid Other

## 2021-02-21 DIAGNOSIS — Z3200 Encounter for pregnancy test, result unknown: Secondary | ICD-10-CM

## 2021-02-24 ENCOUNTER — Other Ambulatory Visit: Payer: Self-pay

## 2021-02-24 ENCOUNTER — Ambulatory Visit (INDEPENDENT_AMBULATORY_CARE_PROVIDER_SITE_OTHER): Payer: Medicaid Other

## 2021-02-24 VITALS — BP 121/82 | HR 99 | Ht 61.0 in | Wt 182.2 lb

## 2021-02-24 DIAGNOSIS — Z3A12 12 weeks gestation of pregnancy: Secondary | ICD-10-CM

## 2021-02-24 DIAGNOSIS — Z1379 Encounter for other screening for genetic and chromosomal anomalies: Secondary | ICD-10-CM

## 2021-02-24 DIAGNOSIS — Z3481 Encounter for supervision of other normal pregnancy, first trimester: Secondary | ICD-10-CM

## 2021-02-24 DIAGNOSIS — Z0283 Encounter for blood-alcohol and blood-drug test: Secondary | ICD-10-CM | POA: Diagnosis not present

## 2021-02-24 DIAGNOSIS — Z113 Encounter for screening for infections with a predominantly sexual mode of transmission: Secondary | ICD-10-CM | POA: Diagnosis not present

## 2021-02-24 DIAGNOSIS — O210 Mild hyperemesis gravidarum: Secondary | ICD-10-CM

## 2021-02-24 DIAGNOSIS — R638 Other symptoms and signs concerning food and fluid intake: Secondary | ICD-10-CM

## 2021-02-24 NOTE — Progress Notes (Signed)
      Charlann Boxer presents for NOB nurse intake visit. Pregnancy confirmation done at [facility], 02/16/2021, with Doreene Burke, CNM.  G 3.  P 1102.  LMP 11/28/2020.  EDD 09/04/2021.  Ga [redacted]w[redacted]d. Pregnancy education material explained and given. 0 cats in the home.  NOB labs ordered. BMI greater than 30. TSH/HbgA1c ordered. Sickle cell not ordered due to race. HIV and drug screen explained and ordered. Genetic screening discussed. Genetic testing; Ordered. Pt requested [id number].  Pt to discuss genetic testing with provider. PNV encouraged. Pt to follow up with provider in 1 weeks for NOB physical.  FMLA form, [facility] Financial Policy and HIV/Drug Screening form all signed and reviewed with patient.

## 2021-02-25 LAB — HEMOGLOBIN A1C
Est. average glucose Bld gHb Est-mCnc: 100 mg/dL
Hgb A1c MFr Bld: 5.1 % (ref 4.8–5.6)

## 2021-02-25 LAB — URINALYSIS, ROUTINE W REFLEX MICROSCOPIC
Bilirubin, UA: NEGATIVE
Glucose, UA: NEGATIVE
Ketones, UA: NEGATIVE
Leukocytes,UA: NEGATIVE
Nitrite, UA: NEGATIVE
Protein,UA: NEGATIVE
RBC, UA: NEGATIVE
Specific Gravity, UA: 1.024 (ref 1.005–1.030)
Urobilinogen, Ur: 0.2 mg/dL (ref 0.2–1.0)
pH, UA: 5.5 (ref 5.0–7.5)

## 2021-02-25 LAB — ANTIBODY SCREEN: Antibody Screen: NEGATIVE

## 2021-02-25 LAB — HCV INTERPRETATION

## 2021-02-25 LAB — VIRAL HEPATITIS HBV, HCV
HCV Ab: <0.1 s/co ratio (ref 0.0–0.9)
Hep B Core Total Ab: NEGATIVE
Hep B Surface Ab, Qual: REACTIVE
Hepatitis B Surface Ag: NEGATIVE

## 2021-02-25 LAB — HIV ANTIBODY (ROUTINE TESTING W REFLEX): HIV Screen 4th Generation wRfx: NONREACTIVE

## 2021-02-25 LAB — RUBELLA SCREEN: Rubella Antibodies, IGG: 3.86 index (ref >0.99)

## 2021-02-25 LAB — TSH: TSH: 1.47 u[IU]/mL (ref 0.450–4.500)

## 2021-02-25 LAB — ABO AND RH: Rh Factor: POSITIVE

## 2021-02-25 LAB — TOXOPLASMA ANTIBODIES- IGG AND  IGM
Toxoplasma Antibody- IgM: <3 AU/mL (ref 0.0–7.9)
Toxoplasma IgG Ratio: <3 IU/mL (ref 0.0–7.1)

## 2021-02-25 LAB — RPR: RPR Ser Ql: NONREACTIVE

## 2021-02-25 LAB — VARICELLA ZOSTER ANTIBODY, IGG: Varicella zoster IgG: 1276 index (ref >165)

## 2021-02-28 ENCOUNTER — Other Ambulatory Visit: Payer: Self-pay | Admitting: Certified Nurse Midwife

## 2021-02-28 LAB — GC/CHLAMYDIA PROBE AMP
Chlamydia trachomatis, NAA: NEGATIVE
Neisseria Gonorrhoeae by PCR: NEGATIVE

## 2021-02-28 LAB — URINE CULTURE, OB REFLEX

## 2021-02-28 LAB — CULTURE, OB URINE

## 2021-02-28 MED ORDER — NITROFURANTOIN MONOHYD MACRO 100 MG PO CAPS: 100.0000 mg | ORAL_CAPSULE | Freq: Two times a day (BID) | ORAL | 0 refills | Status: AC

## 2021-03-01 LAB — MATERNIT 21 PLUS CORE, BLOOD
Fetal Fraction: 7
Result (T21): NEGATIVE
Trisomy 13 (Patau syndrome): NEGATIVE
Trisomy 18 (Edwards syndrome): NEGATIVE
Trisomy 21 (Down syndrome): NEGATIVE

## 2021-03-03 LAB — DRUG PROFILE, UR, 9 DRUGS (LABCORP)
Amphetamines, Urine: POSITIVE — AB
Barbiturate Quant, Ur: NEGATIVE ng/mL
Benzodiazepine Quant, Ur: NEGATIVE ng/mL
Cannabinoid Quant, Ur: POSITIVE — AB
Cocaine (Metab.): NEGATIVE ng/mL
Methadone Screen, Urine: NEGATIVE ng/mL
Opiate Quant, Ur: NEGATIVE ng/mL
PCP Quant, Ur: NEGATIVE ng/mL
Propoxyphene: NEGATIVE ng/mL

## 2021-03-03 LAB — NICOTINE SCREEN, URINE: Cotinine Ql Scrn, Ur: NEGATIVE ng/mL

## 2021-03-04 ENCOUNTER — Other Ambulatory Visit: Payer: Self-pay

## 2021-03-04 ENCOUNTER — Ambulatory Visit (INDEPENDENT_AMBULATORY_CARE_PROVIDER_SITE_OTHER): Payer: Medicaid Other | Admitting: Certified Nurse Midwife

## 2021-03-04 VITALS — BP 152/80 | HR 93 | Wt 181.5 lb

## 2021-03-04 DIAGNOSIS — Z3A13 13 weeks gestation of pregnancy: Secondary | ICD-10-CM | POA: Diagnosis not present

## 2021-03-04 DIAGNOSIS — Z3482 Encounter for supervision of other normal pregnancy, second trimester: Secondary | ICD-10-CM

## 2021-03-04 LAB — POCT URINALYSIS DIPSTICK OB
Bilirubin, UA: NEGATIVE
Blood, UA: NEGATIVE
Glucose, UA: NEGATIVE
Ketones, UA: NEGATIVE
Leukocytes, UA: NEGATIVE
Nitrite, UA: NEGATIVE
POC,PROTEIN,UA: NEGATIVE
Spec Grav, UA: 1.015
Urobilinogen, UA: 0.2 U/dL
pH, UA: 7.5

## 2021-03-04 MED ORDER — DOXYLAMINE-PYRIDOXINE 10-10 MG PO TBEC: 1.0000 | DELAYED_RELEASE_TABLET | Freq: Four times a day (QID) | ORAL | 5 refills | Status: DC

## 2021-03-04 NOTE — Progress Notes (Signed)
NEW OB HISTORY AND PHYSICAL  SUBJECTIVE:       Amanda Barnett is a 25 y.o. [phone] female, Patient's last menstrual period was 11/28/2020., Estimated Date of Delivery: 09/04/21, [redacted]w[redacted]d, presents today for establishment of Prenatal Care. She has no unusual complaints  Married Lives with spouse and children Does not work currently Exercise: none "play with kids" Has history of substance abuse, currently on suboxone 1 mg TID, state she stopped smoking and denies alcohol use. NOB drugs screen positive for amphetamine & Marijuana    Gynecologic History Patient's last menstrual period was 11/28/2020. Normal Contraception: none Last Pap: 03/12/2019. Results were: normal  Obstetric History OB History  Gravida Para Term Preterm AB Living  3 2 1 1   2   SAB IAB Ectopic Multiple Live Births        0 2    # Outcome Date GA Lbr Len/2nd Weight Sex Delivery Anes PTL Lv  3 Current           2 Preterm 02/28/17 [redacted]w[redacted]d 05:21 / 00:45 5 lb 12.1 oz (2.61 kg) M Vag-Spont EPI  LIV  1 Term 08/30/13 [redacted]w[redacted]d  6 lb 11.2 oz (3.039 kg) M Vag-Spont  N LIV    Past Medical History:  Diagnosis Date   Abdominal pain, recurrent    Amenorrhea    Asthma    as a child   GERD (gastroesophageal reflux disease)    Infantile colic    resolved    Past Surgical History:  Procedure Laterality Date   CHOLECYSTECTOMY     RHINOPLASTY N/A 01/07/2015   Procedure: RHINOPLASTY;  Surgeon: 03/09/2015, MD;  Location: [facility];  Service: ENT;  Laterality: N/A;   RHINOPLASTY     SEPTOPLASTY     SEPTOPLASTY N/A 01/07/2015   Procedure: SEPTOPLASTY;  Surgeon: 03/09/2015, MD;  Location: [facility];  Service: ENT;  Laterality: N/A;  GAVE DISK TO [facility] 8-10 KP    Current Outpatient Medications on File Prior to Visit  Medication Sig Dispense Refill   buprenorphine-naloxone (SUBOXONE) 2-0.5 mg SUBL SL tablet Place 1 tablet under the tongue daily.     buPROPion HCl (WELLBUTRIN PO) Take 450 mg by mouth. Take  one tablet daily as needed     Doxylamine-Pyridoxine 10-10 MG TBEC Take 1 tablet by mouth 4 (four) times daily. Day 1 &2: 2 tablet at bedtimeDay 3 : if symptoms persists 1 tablet am; 2 tablet at bedtimeDay 4: 1 tablet am, 1 tab afternoon, 2 tab at bedtime 120 tablet 5   ibuprofen (ADVIL,MOTRIN) 600 MG tablet Take 1 tablet (600 mg total) by mouth every 6 (six) hours. 30 tablet 0   nitrofurantoin, macrocrystal-monohydrate, (MACROBID) 100 MG capsule Take 1 capsule (100 mg total) by mouth 2 (two) times daily for 7 days. 14 capsule 0   Prenatal MV-Min-Fe Fum-FA-DHA (PRENATAL 1 PO) Take by mouth.     sertraline (ZOLOFT) 50 MG tablet Take 1 tablet (50 mg total) by mouth daily. 30 tablet 3   No current facility-administered medications on file prior to visit.    Allergies  Allergen Reactions   Latex Itching    Social History   Socioeconomic History   Marital status: Single    Spouse name: Not on file   Number of children: Not on file   Years of education: Not on file   Highest education level: Not on file  Occupational History   Not on file  Tobacco Use   Smoking status: Former  Packs/day: 0.25    Years: 2.00    Pack years: 0.50    Types: Cigarettes   Smokeless tobacco: Never  Vaping Use   Vaping Use: Never used  Substance and Sexual Activity   Alcohol use: Not Currently   Drug use: Not Currently    Types: Marijuana, Cocaine    Comment: METHADONE x4-5 MONTHS   Sexual activity: Yes    Partners: Male    Birth control/protection: None  Other Topics Concern   Not on file  Social History Narrative   11th grade   Social Determinants of Health   Financial Resource Strain: Not on file  Food Insecurity: Not on file  Transportation Needs: Not on file  Physical Activity: Not on file  Stress: Not on file  Social Connections: Not on file  Intimate Partner Violence: Not on file    Family History  Problem Relation Age of Onset   Pancreatitis Mother    Hypertension Mother     Migraines Mother    Cholelithiasis Maternal Grandmother    Rheum arthritis Maternal Grandmother    Hypertension Maternal Grandmother     The following portions of the patient's history were reviewed and updated as appropriate: allergies, current medications, past OB history, past medical history, past surgical history, past family history, past social history, and problem list.    OBJECTIVE: Initial Physical Exam (New OB)  GENERAL APPEARANCE: alert, well appearing, in no apparent distress, oriented to person, place and time HEAD: normocephalic, atraumatic MOUTH: mucous membranes moist, pharynx normal without lesions THYROID: no thyromegaly or masses present BREASTS: no masses noted, no significant tenderness, no palpable axillary nodes, no skin changes LUNGS: clear to auscultation, no wheezes, rales or rhonchi, symmetric air entry HEART: regular rate and rhythm, no murmurs ABDOMEN: soft, nontender, nondistended, no abnormal masses, no epigastric pain and FHT present EXTREMITIES: no redness or tenderness in the calves or thighs, no edema, no limitation in range of motion, intact peripheral pulses SKIN: normal coloration and turgor, no rashes LYMPH NODES: no adenopathy palpable NEUROLOGIC: alert, oriented, normal speech, no focal findings or movement disorder noted  PELVIC EXAM Deferred , pap not due, tested pelvis   ASSESSMENT: Normal pregnancy  PLAN: Prenatal care See orders New OB counseling: The patient has been given an overview regarding routine prenatal care. Recommendations regarding diet, weight gain, and exercise in pregnancy were given. Prenatal testing, optional genetic testing, carrier screening, and ultrasound use in pregnancy were reviewed.  Discussed substance abuse. 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 Benefits of Breast Feeding were discussed. The patient is encouraged to consider nursing her baby post partum.  Rose (Child psychotherapist). Orders sent in for  Diclegis.   Doreene Burke, CNM

## 2021-03-04 NOTE — Patient Instructions (Signed)
Prenatal Care ?Prenatal care is health care during pregnancy. It helps you and your unborn baby (fetus) stay as healthy as possible. Prenatal care may be provided by a midwife, a family practice doctor, a mid-level practitioner (nurse practitioner or physician assistant), or a childbirth and pregnancy doctor (obstetrician). ?How does this affect me? ?During pregnancy, you will be closely monitored for any new conditions that might develop. To lower your risk of pregnancy complications, you and your health care provider will talk about any underlying conditions you have. ?How does this affect my baby? ?Early and consistent prenatal care increases the chance that your baby will be healthy during pregnancy. Prenatal care lowers the risk that your baby will be: ?Born early (prematurely). ?Smaller than expected at birth (small for gestational age). ?What can I expect at the first prenatal care visit? ?Your first prenatal care visit will likely be the longest. You should schedule your first prenatal care visit as soon as you know that you are pregnant. Your first visit is a good time to talk about any questions or concerns you have about pregnancy. ?Medical history ?At your visit, you and your health care provider will talk about your medical history, including: ?Any past pregnancies. ?Your family's medical history. ?Medical history of the baby's father. ?Any long-term (chronic) health conditions you have and how you manage them. ?Any surgeries or procedures you have had. ?Any current over-the-counter or prescription medicines, herbs, or supplements that you are taking. ?Other factors that could pose a risk to your baby, including: ?Exposure to harmful chemicals or radiation at work or at home. ?Any substance use, including tobacco, alcohol, and drug use. ?Your home setting and your stress levels, including: ?Exposure to abuse or violence. ?Household financial strain. ?Your daily health habits, including diet and  exercise. ?Tests and screenings ?Your health care provider will: ?Measure your weight, height, and blood pressure. ?Do a physical exam, including a pelvic and breast exam. ?Perform blood tests and urine tests to check for: ?Urinary tract infection. ?Sexually transmitted infections (STIs). ?Low iron levels in your blood (anemia). ?Blood type and certain proteins on red blood cells (Rh antibodies). ?Infections and immunity to viruses, such as hepatitis B and rubella. ?HIV (human immunodeficiency virus). ?Discuss your options for genetic screening. ?Tips about staying healthy ?Your health care provider will also give you information about how to keep yourself and your baby healthy, including: ?Nutrition and taking vitamins. ?Physical activity. ?How to manage pregnancy symptoms such as nausea and vomiting (morning sickness). ?Infections and substances that may be harmful to your baby and how to avoid them. ?Food safety. ?Dental care. ?Working. ?Travel. ?Warning signs to watch for and when to call your health care provider. ?How often will I have prenatal care visits? ?After your first prenatal care visit, you will have regular visits throughout your pregnancy. The visit schedule is often as follows: ?Up to week 28 of pregnancy: once every 4 weeks. ?28-36 weeks: once every 2 weeks. ?After 36 weeks: every week until delivery. ?Some women may have visits more or less often depending on any underlying health conditions and the health of the baby. ?Keep all follow-up and prenatal care visits. This is important. ?What happens during routine prenatal care visits? ?Your health care provider will: ?Measure your weight and blood pressure. ?Check for fetal heart sounds. ?Measure the height of your uterus in your abdomen (fundal height). This may be measured starting around week 20 of pregnancy. ?Check the position of your baby inside your uterus. ?Ask questions   about your diet, sleeping patterns, and whether you can feel the baby  move. ?Review warning signs to watch for and signs of labor. ?Ask about any pregnancy symptoms you are having and how you are dealing with them. Symptoms may include: ?Headaches. ?Nausea and vomiting. ?Vaginal discharge. ?Swelling. ?Fatigue. ?Constipation. ?Changes in your vision. ?Feeling persistently sad or anxious. ?Any discomfort, including back or pelvic pain. ?Bleeding or spotting. ?Make a list of questions to ask your health care provider at your routine visits. ?What tests might I have during prenatal care visits? ?You may have blood, urine, and imaging tests throughout your pregnancy, such as: ?Urine tests to check for glucose, protein, or signs of infection. ?Glucose tests to check for a form of diabetes that can develop during pregnancy (gestational diabetes mellitus). This is usually done around week 24 of pregnancy. ?Ultrasounds to check your baby's growth and development, to check for birth defects, and to check your baby's well-being. These can also help to decide when you should deliver your baby. ?A test to check for group B strep (GBS) infection. This is usually done around week 36 of pregnancy. ?Genetic testing. This may include blood, fluid, or tissue sampling, or imaging tests, such as an ultrasound. Some genetic tests are done during the first trimester and some are done during the second trimester. ?What else can I expect during prenatal care visits? ?Your health care provider may recommend getting certain vaccines during pregnancy. These may include: ?A yearly flu shot (annual influenza vaccine). This is especially important if you will be pregnant during flu season. ?Tdap (tetanus, diphtheria, pertussis) vaccine. Getting this vaccine during pregnancy can protect your baby from whooping cough (pertussis) after birth. This vaccine may be recommended between weeks 27 and 36 of pregnancy. ?A COVID-19 vaccine. ?Later in your pregnancy, your health care provider may give you information  about: ?Childbirth and breastfeeding classes. ?Choosing a health care provider for your baby. ?Umbilical cord banking. ?Breastfeeding. ?Birth control after your baby is born. ?The hospital labor and delivery unit and how to set up a tour. ?Registering at the hospital before you go into labor. ?Where to find more information ?Office on Women's Health: womenshealth.gov ?American Pregnancy Association: americanpregnancy.org ?March of Dimes: marchofdimes.org ?Summary ?Prenatal care helps you and your baby stay as healthy as possible during pregnancy. ?Your first prenatal care visit will most likely be the longest. ?You will have visits and tests throughout your pregnancy to monitor your health and your baby's health. ?Bring a list of questions to your visits to ask your health care provider. ?Make sure to keep all follow-up and prenatal care visits. ?This information is not intended to replace advice given to you by your health care provider. Make sure you discuss any questions you have with your health care provider. ?Document Revised: 02/26/2020 Document Reviewed: 02/26/2020 ?Elsevier Patient Education ? 2022 Elsevier Inc. ? ?

## 2021-03-05 LAB — CBC WITH DIFFERENTIAL/PLATELET
Basophils Absolute: 0 10*3/uL (ref 0.0–0.2)
Basos: 1 %
EOS (ABSOLUTE): 0.1 10*3/uL (ref 0.0–0.4)
Eos: 1 %
Hematocrit: 38.3 % (ref 34.0–46.6)
Hemoglobin: 12.8 g/dL (ref 11.1–15.9)
Immature Grans (Abs): 0 10*3/uL (ref 0.0–0.1)
Immature Granulocytes: 0 %
Lymphocytes Absolute: 0.9 10*3/uL (ref 0.7–3.1)
Lymphs: 24 %
MCH: 30.9 pg (ref 26.6–33.0)
MCHC: 33.4 g/dL (ref 31.5–35.7)
MCV: 93 fL (ref 79–97)
Monocytes Absolute: 0.3 10*3/uL (ref 0.1–0.9)
Monocytes: 8 %
Neutrophils Absolute: 2.5 10*3/uL (ref 1.4–7.0)
Neutrophils: 66 %
Platelets: 259 10*3/uL (ref 150–450)
RBC: 4.14 x10E6/uL (ref 3.77–5.28)
RDW: 11.7 % (ref 11.7–15.4)
WBC: 3.8 10*3/uL (ref 3.4–10.8)

## 2021-03-31 ENCOUNTER — Other Ambulatory Visit: Payer: Self-pay | Admitting: Certified Nurse Midwife

## 2021-04-04 ENCOUNTER — Other Ambulatory Visit: Payer: Self-pay

## 2021-04-04 ENCOUNTER — Ambulatory Visit (INDEPENDENT_AMBULATORY_CARE_PROVIDER_SITE_OTHER): Payer: Medicaid Other | Admitting: Certified Nurse Midwife

## 2021-04-04 VITALS — BP 115/72 | HR 80 | Wt 186.5 lb

## 2021-04-04 DIAGNOSIS — Z3A18 18 weeks gestation of pregnancy: Secondary | ICD-10-CM

## 2021-04-04 DIAGNOSIS — Z3482 Encounter for supervision of other normal pregnancy, second trimester: Secondary | ICD-10-CM

## 2021-04-04 DIAGNOSIS — R399 Unspecified symptoms and signs involving the genitourinary system: Secondary | ICD-10-CM

## 2021-04-04 LAB — POCT URINALYSIS DIPSTICK OB
Bilirubin, UA: NEGATIVE
Blood, UA: NEGATIVE
Glucose, UA: NEGATIVE
Ketones, UA: NEGATIVE
Leukocytes, UA: NEGATIVE
Nitrite, UA: NEGATIVE
POC,PROTEIN,UA: NEGATIVE
Spec Grav, UA: 1.02 (ref 1.010–1.025)
Urobilinogen, UA: 0.2 E.U./dL
pH, UA: 7 (ref 5.0–8.0)

## 2021-04-04 NOTE — Patient Instructions (Signed)
Round Ligament Pain The round ligaments are a pair of cord-like tissues that help support the uterus. They can become a source of pain during pregnancy as the ligaments soften and stretch as the baby grows. The pain usually begins in the second trimester (13-28 weeks) of pregnancy, and should only last for a few seconds when it occurs. However, the pain can come and go until the baby is delivered. The pain does not cause harm to the baby. Round ligament pain is usually a short, sharp, and pinching pain, but it can also be a dull, lingering, and aching pain. The pain is felt in the lower side of the abdomen or in the groin. It usually starts deep in the groin and moves up to the outside of the hip area. The pain may happen when you: Suddenly change position, such as quickly going from a sitting to standing position. Do physical activity. Cough or sneeze. Follow these instructions at home: Managing pain  When the pain starts, relax. Then, try any of these methods to help with the pain: Sit down. Flex your knees up to your abdomen. Lie on your side with one pillow under your abdomen and another pillow between your legs. Sit in a warm bath for 15-20 minutes or until the pain goes away. General instructions Watch your condition for any changes. Move slowly when you sit down or stand up. Stop or reduce your physical activities if they cause pain. Avoid long walks if they cause pain. Take over-the-counter and prescription medicines only as told by your health care provider. Keep all follow-up visits. This is important. Contact a health care provider if: Your pain does not go away with treatment. You feel pain in your back that you did not have before. Your medicine is not helping. You have a fever or chills. You have nausea or vomiting. You have diarrhea. You have pain when you urinate. Get help right away if: You have pain that is a rhythmic, cramping pain similar to labor pains. Labor pains  are usually 2 minutes apart, last for about 1 minute, and involve a bearing down feeling or pressure in your pelvis. You have vaginal bleeding. These symptoms may represent a serious problem that is an emergency. Do not wait to see if the symptoms will go away. Get medical help right away. Call your local emergency services (911 in the [place] Do not drive yourself to the hospital. Summary Round ligament pain is felt in the lower abdomen or groin. This pain usually begins in the second trimester (13-28 weeks) and should only last for a few seconds when it occurs. You may notice the pain when you suddenly change position, when you cough or sneeze, or during physical activity. Relaxing, flexing your knees to your abdomen, lying on one side, or taking a warm bath may help to get rid of the pain. Contact your health care provider if the pain does not go away. This information is not intended to replace advice given to you by your health care provider. Make sure you discuss any questions you have with your health care provider. Document Revised: 07/28/2020 Document Reviewed: 07/28/2020 Elsevier Patient Education  2022 Elsevier Inc.  

## 2021-04-04 NOTE — Progress Notes (Signed)
ROB doing well, feeling some movement. She denies any complaints today. Has u/s for anatomy scheduled. Urine culture today for TOC, will follow up with results. ROB in 2 wks.   Doreene Burke, CNM

## 2021-04-08 LAB — URINE CULTURE

## 2021-04-19 ENCOUNTER — Ambulatory Visit (INDEPENDENT_AMBULATORY_CARE_PROVIDER_SITE_OTHER): Payer: Medicaid Other | Admitting: Certified Nurse Midwife

## 2021-04-19 ENCOUNTER — Ambulatory Visit (INDEPENDENT_AMBULATORY_CARE_PROVIDER_SITE_OTHER): Payer: Medicaid Other

## 2021-04-19 ENCOUNTER — Other Ambulatory Visit: Payer: Self-pay

## 2021-04-19 ENCOUNTER — Other Ambulatory Visit: Payer: Self-pay | Admitting: Certified Nurse Midwife

## 2021-04-19 VITALS — BP 113/75 | HR 70 | Wt 185.3 lb

## 2021-04-19 DIAGNOSIS — Z3482 Encounter for supervision of other normal pregnancy, second trimester: Secondary | ICD-10-CM

## 2021-04-19 DIAGNOSIS — Z3A20 20 weeks gestation of pregnancy: Secondary | ICD-10-CM

## 2021-04-19 LAB — POCT URINALYSIS DIPSTICK OB
Bilirubin, UA: NEGATIVE
Blood, UA: NEGATIVE
Glucose, UA: NEGATIVE
Ketones, UA: NEGATIVE
Leukocytes, UA: NEGATIVE
Nitrite, UA: NEGATIVE
Spec Grav, UA: 1.015 (ref 1.010–1.025)
Urobilinogen, UA: 0.2 E.U./dL
pH, UA: 5.5 (ref 5.0–8.0)

## 2021-04-19 NOTE — Progress Notes (Signed)
ROB and u/s today for anatomy. Results not available at this time. Will review and follow up with pt once avoidable. She denies any concerns or issues at this time. She is on the same does of Suboxone. Follow up 4 wks for ROB.   Doreene Burke, CNM

## 2021-04-19 NOTE — Patient Instructions (Signed)
Round Ligament Pain The round ligaments are a pair of cord-like tissues that help support the uterus. They can become a source of pain during pregnancy as the ligaments soften and stretch as the baby grows. The pain usually begins in the second trimester (13-28 weeks) of pregnancy, and should only last for a few seconds when it occurs. However, the pain can come and go until the baby is delivered. The pain does not cause harm to the baby. Round ligament pain is usually a short, sharp, and pinching pain, but it can also be a dull, lingering, and aching pain. The pain is felt in the lower side of the abdomen or in the groin. It usually starts deep in the groin and moves up to the outside of the hip area. The pain may happen when you: Suddenly change position, such as quickly going from a sitting to standing position. Do physical activity. Cough or sneeze. Follow these instructions at home: Managing pain  When the pain starts, relax. Then, try any of these methods to help with the pain: Sit down. Flex your knees up to your abdomen. Lie on your side with one pillow under your abdomen and another pillow between your legs. Sit in a warm bath for 15-20 minutes or until the pain goes away. General instructions Watch your condition for any changes. Move slowly when you sit down or stand up. Stop or reduce your physical activities if they cause pain. Avoid long walks if they cause pain. Take over-the-counter and prescription medicines only as told by your health care provider. Keep all follow-up visits. This is important. Contact a health care provider if: Your pain does not go away with treatment. You feel pain in your back that you did not have before. Your medicine is not helping. You have a fever or chills. You have nausea or vomiting. You have diarrhea. You have pain when you urinate. Get help right away if: You have pain that is a rhythmic, cramping pain similar to labor pains. Labor pains  are usually 2 minutes apart, last for about 1 minute, and involve a bearing down feeling or pressure in your pelvis. You have vaginal bleeding. These symptoms may represent a serious problem that is an emergency. Do not wait to see if the symptoms will go away. Get medical help right away. Call your local emergency services (911 in the [place] Do not drive yourself to the hospital. Summary Round ligament pain is felt in the lower abdomen or groin. This pain usually begins in the second trimester (13-28 weeks) and should only last for a few seconds when it occurs. You may notice the pain when you suddenly change position, when you cough or sneeze, or during physical activity. Relaxing, flexing your knees to your abdomen, lying on one side, or taking a warm bath may help to get rid of the pain. Contact your health care provider if the pain does not go away. This information is not intended to replace advice given to you by your health care provider. Make sure you discuss any questions you have with your health care provider. Document Revised: 07/28/2020 Document Reviewed: 07/28/2020 Elsevier Patient Education  2022 Elsevier Inc.  

## 2021-04-21 ENCOUNTER — Encounter: Payer: Self-pay | Admitting: Certified Nurse Midwife

## 2021-05-18 ENCOUNTER — Encounter: Payer: Medicaid Other | Admitting: Certified Nurse Midwife

## 2021-05-18 DIAGNOSIS — Z3A24 24 weeks gestation of pregnancy: Secondary | ICD-10-CM

## 2021-05-18 DIAGNOSIS — Z3482 Encounter for supervision of other normal pregnancy, second trimester: Secondary | ICD-10-CM

## 2021-05-29 NOTE — L&D Delivery Note (Signed)
Delivery Note  ? ?Amanda Barnett is a 26 y.o. [id number] at [redacted]w[redacted]d Estimated Date of Delivery: 09/04/21 ? ?PRE-OPERATIVE DIAGNOSIS:  ?1) [redacted]w[redacted]d pregnancy.  ?Suboxone use in pregnancy ? ?POST-OPERATIVE DIAGNOSIS:  ?1) [redacted]w[redacted]d pregnancy s/p Vaginal, Spontaneous  ? ?Delivery Type: Vaginal, Spontaneous   ? ?Delivery Anesthesia: Epidural  ? ?Labor Complications:  None ?  ? ?ESTIMATED BLOOD LOSS: 100  ml   ? ?FINDINGS:   ?1) female infant, Apgar scores of 6   at 1 minute and 9   at 5 minutes and a birthweight of 6 lbs., 9 oz. ? ?SPECIMENS:  ? PLACENTA: ?  Appearance: Intact  ?  Removal: Spontaneous    ?  Disposition:  Per protocol ? CORD BLOOD: Not Indicated ? ?DISPOSITION:  ?Infant to left in stable condition in the delivery room, with L&D personnel and mother, ? ?NARRATIVE SUMMARY: ?Labor course:  [name] is a [id number] at [redacted]w[redacted]d who presented to Labor & Delivery for induction of labor. Her initial cervical exam was 1/50/-3. Labor proceeded with Cytotec, Pitocin, and AROM, and she was found to be completely dilated at 1400. ?With excellent maternal pushing effort, she birthed a viable female infant at 57. The shoulders were birthed without difficulty. The infant was placed skin-to-skin with [facility]. The cord was doubly clamped and cut by the FOB when pulsations ceased. ?The placenta delivered spontaneously Damita Dunnings) and was noted to be intact with a 3VC. ?A perineal and vaginal examination was performed. ?Lacerations: None , small labial abrasion ?The patient tolerated this well. Mother and baby were left in stable condition. She will return for a postpartum tubal in 6 weeks. ?This birth was proctored by Dr. Amalia Hailey. ? ?Lloyd Huger, CNM ?09/10/2021 ?3:06 PM  ?

## 2021-06-06 ENCOUNTER — Other Ambulatory Visit: Payer: Self-pay

## 2021-06-06 ENCOUNTER — Ambulatory Visit (INDEPENDENT_AMBULATORY_CARE_PROVIDER_SITE_OTHER): Payer: Medicaid Other | Admitting: Certified Nurse Midwife

## 2021-06-06 ENCOUNTER — Encounter: Payer: Self-pay | Admitting: Certified Nurse Midwife

## 2021-06-06 VITALS — BP 122/79 | HR 90 | Wt 188.8 lb

## 2021-06-06 DIAGNOSIS — R399 Unspecified symptoms and signs involving the genitourinary system: Secondary | ICD-10-CM

## 2021-06-06 DIAGNOSIS — Z3482 Encounter for supervision of other normal pregnancy, second trimester: Secondary | ICD-10-CM

## 2021-06-06 DIAGNOSIS — Z3A27 27 weeks gestation of pregnancy: Secondary | ICD-10-CM

## 2021-06-06 LAB — POCT URINALYSIS DIPSTICK OB
Bilirubin, UA: NEGATIVE
Blood, UA: NEGATIVE
Glucose, UA: NEGATIVE
Ketones, UA: NEGATIVE
Leukocytes, UA: NEGATIVE
Nitrite, UA: NEGATIVE
POC,PROTEIN,UA: NEGATIVE
Spec Grav, UA: 1.01 (ref 1.010–1.025)
Urobilinogen, UA: 0.2 E.U./dL
pH, UA: 7.5 (ref 5.0–8.0)

## 2021-06-06 NOTE — Progress Notes (Signed)
ROB doing well, state she missed due to holidays and work. She states she take same dose of suboxone . She feels good movement. Discussed glucose screen 1 wk and u/s for growth, she will also need repeat drug screen ( per protocol). Follow up 1 wk for ROB with Missy .   Doreene Burke, CNM

## 2021-06-06 NOTE — Patient Instructions (Signed)
Oral Glucose Tolerance Test During Pregnancy °Why am I having this test? °The oral glucose tolerance test (OGTT) is done to check how your body processes blood sugar (glucose). This is one of several tests used to diagnose diabetes that develops during pregnancy (gestational diabetes mellitus). Gestational diabetes is a short-term form of diabetes that some women develop while they are pregnant. It usually occurs during the second trimester of pregnancy and goes away after delivery. °Testing, or screening, for gestational diabetes usually occurs at weeks 24-28 of pregnancy. You may have the OGTT test after having a 1-hour glucose screening test if the results from that test indicate that you may have gestational diabetes. This test may also be needed if: °You have a history of gestational diabetes. °There is a history of giving birth to very large babies or of losing pregnancies (having stillbirths). °You have signs and symptoms of diabetes, such as: °Changes in your eyesight. °Tingling or numbness in your hands or feet. °Changes in hunger, thirst, and urination, and these are not explained by your pregnancy. °What is being tested? °This test measures the amount of glucose in your blood at different times during a period of 3 hours. This shows how well your body can process glucose. °What kind of sample is taken? °Blood samples are required for this test. They are usually collected by inserting a needle into a blood vessel. °How do I prepare for this test? °For 3 days before your test, eat normally. Have plenty of carbohydrate-rich foods. °Follow instructions from your health care provider about: °Eating or drinking restrictions on the day of the test. You may be asked not to eat or drink anything other than water (to fast) starting 8-10 hours before the test. °Changing or stopping your regular medicines. Some medicines may interfere with this test. °Tell a health care provider about: °All medicines you are taking,  including vitamins, herbs, eye drops, creams, and over-the-counter medicines. °Any blood disorders you have. °Any surgeries you have had. °Any medical conditions you have. °What happens during the test? °First, your blood glucose will be measured. This is referred to as your fasting blood glucose because you fasted before the test. Then, you will drink a glucose solution that contains a certain amount of glucose. Your blood glucose will be measured again 1, 2, and 3 hours after you drink the solution. °This test takes about 3 hours to complete. You will need to stay at the testing location during this time. During the testing period: °Do not eat or drink anything other than the glucose solution. °Do not exercise. °Do not use any products that contain nicotine or tobacco, such as cigarettes, e-cigarettes, and chewing tobacco. These can affect your test results. If you need help quitting, ask your health care provider. °The testing procedure may vary among health care providers and hospitals. °How are the results reported? °Your results will be reported as milligrams of glucose per deciliter of blood (mg/dL) or millimoles per liter (mmol/L). There is more than one source for screening and diagnosis reference values used to diagnose gestational diabetes. Your health care provider will compare your results to normal values that were established after testing a large group of people (reference values). Reference values may vary among labs and hospitals. For this test (Carpenter-Coustan), reference values are: °Fasting: 95 mg/dL (5.3 mmol/L). °1 hour: 180 mg/dL (10.0 mmol/L). °2 hour: 155 mg/dL (8.6 mmol/L). °3 hour: 140 mg/dL (7.8 mmol/L). °What do the results mean? °Results below the reference values are considered   normal. If two or more of your blood glucose levels are at or above the reference values, you may be diagnosed with gestational diabetes. If only one level is high, your health care provider may suggest  repeat testing or other tests to confirm a diagnosis. °Talk with your health care provider about what your results mean. °Questions to ask your health care provider °Ask your health care provider, or the department that is doing the test: °When will my results be ready? °How will I get my results? °What are my treatment options? °What other tests do I need? °What are my next steps? °Summary °The oral glucose tolerance test (OGTT) is one of several tests used to diagnose diabetes that develops during pregnancy (gestational diabetes mellitus). Gestational diabetes is a short-term form of diabetes that some women develop while they are pregnant. °You may have the OGTT test after having a 1-hour glucose screening test if the results from that test show that you may have gestational diabetes. You may also have this test if you have any symptoms or risk factors for this type of diabetes. °Talk with your health care provider about what your results mean. °This information is not intended to replace advice given to you by your health care provider. Make sure you discuss any questions you have with your health care provider. °Document Revised: 10/23/2019 Document Reviewed: 10/23/2019 °Elsevier Patient Education © 2022 Elsevier Inc. ° °

## 2021-06-08 LAB — URINE CULTURE

## 2021-06-13 ENCOUNTER — Ambulatory Visit (INDEPENDENT_AMBULATORY_CARE_PROVIDER_SITE_OTHER): Payer: Medicaid Other

## 2021-06-13 ENCOUNTER — Other Ambulatory Visit: Payer: Self-pay

## 2021-06-13 ENCOUNTER — Other Ambulatory Visit: Payer: Medicaid Other

## 2021-06-13 DIAGNOSIS — Z113 Encounter for screening for infections with a predominantly sexual mode of transmission: Secondary | ICD-10-CM

## 2021-06-13 DIAGNOSIS — Z3482 Encounter for supervision of other normal pregnancy, second trimester: Secondary | ICD-10-CM

## 2021-06-13 DIAGNOSIS — Z3A28 28 weeks gestation of pregnancy: Secondary | ICD-10-CM

## 2021-06-13 DIAGNOSIS — Z3A27 27 weeks gestation of pregnancy: Secondary | ICD-10-CM | POA: Diagnosis not present

## 2021-06-13 DIAGNOSIS — Z3483 Encounter for supervision of other normal pregnancy, third trimester: Secondary | ICD-10-CM

## 2021-06-13 DIAGNOSIS — Z131 Encounter for screening for diabetes mellitus: Secondary | ICD-10-CM

## 2021-06-14 ENCOUNTER — Encounter: Payer: Self-pay | Admitting: Obstetrics

## 2021-06-14 LAB — CBC
Hematocrit: 38.3 % (ref 34.0–46.6)
Hemoglobin: 12.8 g/dL (ref 11.1–15.9)
MCH: 30.8 pg (ref 26.6–33.0)
MCHC: 33.4 g/dL (ref 31.5–35.7)
MCV: 92 fL (ref 79–97)
Platelets: 260 10*3/uL (ref 150–450)
RBC: 4.16 x10E6/uL (ref 3.77–5.28)
RDW: 12.1 % (ref 11.7–15.4)
WBC: 6.3 10*3/uL (ref 3.4–10.8)

## 2021-06-14 LAB — RPR: RPR Ser Ql: NONREACTIVE

## 2021-06-14 LAB — GLUCOSE, 1 HOUR GESTATIONAL: Gestational Diabetes Screen: 99 mg/dL (ref 70–139)

## 2021-07-01 ENCOUNTER — Ambulatory Visit (INDEPENDENT_AMBULATORY_CARE_PROVIDER_SITE_OTHER): Payer: Medicaid Other | Admitting: Obstetrics

## 2021-07-01 ENCOUNTER — Other Ambulatory Visit: Payer: Self-pay

## 2021-07-01 ENCOUNTER — Telehealth: Payer: Self-pay | Admitting: Obstetrics

## 2021-07-01 VITALS — BP 129/78 | HR 98 | Wt 191.0 lb

## 2021-07-01 DIAGNOSIS — Z3482 Encounter for supervision of other normal pregnancy, second trimester: Secondary | ICD-10-CM

## 2021-07-01 DIAGNOSIS — Z0283 Encounter for blood-alcohol and blood-drug test: Secondary | ICD-10-CM

## 2021-07-01 DIAGNOSIS — Z3A30 30 weeks gestation of pregnancy: Secondary | ICD-10-CM

## 2021-07-01 LAB — POCT URINALYSIS DIPSTICK OB
Bilirubin, UA: NEGATIVE
Blood, UA: NEGATIVE
Glucose, UA: NEGATIVE
Ketones, UA: NEGATIVE
Leukocytes, UA: NEGATIVE
Nitrite, UA: NEGATIVE
POC,PROTEIN,UA: NEGATIVE
Spec Grav, UA: 1.015 (ref 1.010–1.025)
Urobilinogen, UA: NEGATIVE E.U./dL — AB
pH, UA: 7 (ref 5.0–8.0)

## 2021-07-01 NOTE — Telephone Encounter (Signed)
Received signed medicaid sterilization consent for pt- placed in consent binder at referral cord desk for future sx.

## 2021-07-01 NOTE — Progress Notes (Signed)
ROB at [redacted]w[redacted]d. Good fetal movement. Feeling well but tired. Lots of stressors at home. Coping well. Still taking suboxone TID. Agrees to UDS today. Wants BTL - consent signed. Growth scan at 32 weeks. ROB in 2 weeks. Will schedule BTL consult.  Guadlupe Spanish, CNM

## 2021-07-06 ENCOUNTER — Other Ambulatory Visit: Payer: Self-pay

## 2021-07-06 ENCOUNTER — Ambulatory Visit (INDEPENDENT_AMBULATORY_CARE_PROVIDER_SITE_OTHER): Payer: Medicaid Other

## 2021-07-06 DIAGNOSIS — Z3482 Encounter for supervision of other normal pregnancy, second trimester: Secondary | ICD-10-CM

## 2021-07-07 LAB — BUPRENORPHINE CONFIRM, URINE
Buprenorphine Confirm: 214 ng/mL
Buprenorphine: POSITIVE — AB
Buprenorphine: POSITIVE — AB
Norbuprenorphine Confirm: 711 ng/mL
Norbuprenorphine: POSITIVE — AB

## 2021-07-07 LAB — MONITOR DRUG PROFILE 14(MW)
BARBITURATE SCREEN URINE: NEGATIVE ng/mL
BENZODIAZEPINE SCREEN, URINE: NEGATIVE ng/mL
Cocaine (Metab) Scrn, Ur: NEGATIVE ng/mL
Creatinine(Crt), U: 118.3 mg/dL (ref 20.0–300.0)
Fentanyl, Urine: NEGATIVE pg/mL
Meperidine Screen, Urine: NEGATIVE ng/mL
Methadone Screen, Urine: NEGATIVE ng/mL
OXYCODONE+OXYMORPHONE UR QL SCN: NEGATIVE ng/mL
Opiate Scrn, Ur: NEGATIVE ng/mL
Ph of Urine: 7.4 (ref 4.5–8.9)
Phencyclidine Qn, Ur: NEGATIVE ng/mL
Propoxyphene Scrn, Ur: NEGATIVE ng/mL
SPECIFIC GRAVITY: 1.019
Tramadol Screen, Urine: NEGATIVE ng/mL

## 2021-07-07 LAB — AMPHETAMINE (GC/MS), URINE: Amphetamines: NEGATIVE

## 2021-07-07 LAB — CANNABINOID (GC/MS), URINE
Cannabinoid: POSITIVE — AB
Carboxy THC (GC/MS): 156 ng/mL

## 2021-07-15 ENCOUNTER — Encounter: Payer: Medicaid Other | Admitting: Obstetrics and Gynecology

## 2021-07-15 DIAGNOSIS — Z3A32 32 weeks gestation of pregnancy: Secondary | ICD-10-CM

## 2021-07-15 DIAGNOSIS — Z3483 Encounter for supervision of other normal pregnancy, third trimester: Secondary | ICD-10-CM

## 2021-07-23 ENCOUNTER — Observation Stay
Admission: EM | Admit: 2021-07-23 | Discharge: 2021-07-24 | Disposition: A | Discharge status: Home / Self Care | Payer: Medicaid Other | Attending: Obstetrics | Admitting: Obstetrics

## 2021-07-23 ENCOUNTER — Encounter: Payer: Self-pay | Admitting: Obstetrics and Gynecology

## 2021-07-23 ENCOUNTER — Other Ambulatory Visit: Payer: Self-pay

## 2021-07-23 DIAGNOSIS — O09893 Supervision of other high risk pregnancies, third trimester: Secondary | ICD-10-CM | POA: Diagnosis not present

## 2021-07-23 DIAGNOSIS — Z638 Other specified problems related to primary support group: Secondary | ICD-10-CM | POA: Insufficient documentation

## 2021-07-23 DIAGNOSIS — Z3A33 33 weeks gestation of pregnancy: Secondary | ICD-10-CM | POA: Diagnosis not present

## 2021-07-23 DIAGNOSIS — F439 Reaction to severe stress, unspecified: Secondary | ICD-10-CM | POA: Diagnosis not present

## 2021-07-23 DIAGNOSIS — O0973 Supervision of high risk pregnancy due to social problems, third trimester: Principal | ICD-10-CM | POA: Insufficient documentation

## 2021-07-23 DIAGNOSIS — Z79891 Long term (current) use of opiate analgesic: Secondary | ICD-10-CM

## 2021-07-23 DIAGNOSIS — F1111 Opioid abuse, in remission: Secondary | ICD-10-CM

## 2021-07-23 DIAGNOSIS — O99343 Other mental disorders complicating pregnancy, third trimester: Secondary | ICD-10-CM | POA: Diagnosis not present

## 2021-07-23 NOTE — OB Triage Note (Signed)
LABOR & DELIVERY OB TRIAGE NOTE  SUBJECTIVE  HPI Amanda Barnett is a 26 y.o. [id number] at [redacted]w[redacted]d who presents to Labor & Delivery to check in on the health of her pregnancy. She currently has multiple social stressors and is doing well with her Suboxone therapy. She feels safe at home but has had a difficult day. She denies contractions, pain, LOF, and vaginal bleeding. She does not feel that she is in preterm labor, but she would like a cervical exam and a non-stress test for reassurance.  OB History     Gravida  3   Para  2   Term  1   Preterm  1   AB      Living  2      SAB      IAB      Ectopic      Multiple  0   Live Births  2          OBJECTIVE  BP 123/75    Pulse 90    Temp 99.3 F (37.4 C) (Oral)    Resp 18    Ht 5\' 1"  (1.549 m)    Wt 86.6 kg    LMP 11/28/2020    BMI 36.09 kg/m   NST I reviewed the NST and it was reactive.  Baseline: 120 Variability: moderate Accelerations: present, 15x15 Decelerations:none Toco: No contractions Category 1  Cervical exam: 0.5/thick/ballotable by R.Jeani Hawking, RN  ASSESSMENT Impression  1) Pregnancy at [id number], [redacted]w[redacted]d, Estimated Date of Delivery: 09/04/21 2) Reassuring maternal/fetal status 3) Family and social stressors. At risk for substance use relapse.  PLAN  1) Reassure patient of current fetal well-being 2) Discharge home with standard labor precautions. May rest until feeling stable enough to return home. Return to hospital with concerns about relapse or mental health needs.  3) ROB visit not currently scheduled. Schedule ASAP. 4) Outpatient consult with social worker to assess needs.  Lloyd Huger, CNM

## 2021-07-23 NOTE — OB Triage Note (Signed)
Pt arrives G3 P2 with c/o having a bad day and just wanting to check on baby. Pt denies ctx, LOF, or bleeding at this time and wants to have a cervical check. Pt is in NAD.

## 2021-07-24 DIAGNOSIS — O0973 Supervision of high risk pregnancy due to social problems, third trimester: Secondary | ICD-10-CM | POA: Diagnosis not present

## 2021-07-27 ENCOUNTER — Other Ambulatory Visit: Payer: Self-pay

## 2021-07-27 ENCOUNTER — Ambulatory Visit (INDEPENDENT_AMBULATORY_CARE_PROVIDER_SITE_OTHER): Payer: Medicaid Other | Admitting: Obstetrics

## 2021-07-27 VITALS — BP 119/75 | HR 105 | Wt 198.6 lb

## 2021-07-27 DIAGNOSIS — R399 Unspecified symptoms and signs involving the genitourinary system: Secondary | ICD-10-CM

## 2021-07-27 DIAGNOSIS — O0993 Supervision of high risk pregnancy, unspecified, third trimester: Secondary | ICD-10-CM

## 2021-07-27 DIAGNOSIS — Z3A34 34 weeks gestation of pregnancy: Secondary | ICD-10-CM

## 2021-07-27 DIAGNOSIS — Z3483 Encounter for supervision of other normal pregnancy, third trimester: Secondary | ICD-10-CM

## 2021-07-27 LAB — POCT URINALYSIS DIPSTICK OB
Bilirubin, UA: NEGATIVE
Blood, UA: NEGATIVE
Glucose, UA: NEGATIVE
Ketones, UA: NEGATIVE
Leukocytes, UA: NEGATIVE
Nitrite, UA: NEGATIVE
POC,PROTEIN,UA: NEGATIVE
Spec Grav, UA: 1.01 (ref 1.010–1.025)
Urobilinogen, UA: 0.2 E.U./dL
pH, UA: 7 (ref 5.0–8.0)

## 2021-07-27 NOTE — Progress Notes (Signed)
ROB at [redacted]w[redacted]d. Good fetal movement. Denies VB, LOF, contractions. Went to the hospital over the weekend to check on fetal well-being. Many life stressors right now. Working hard to make good decisions. Had stopped taking Prozac but is back on it now. Having some urinary symptoms. Culture sent. Had visit with case manager today and appointment scheduled for [facility]. Repeat growth scan in 2 weeks. GBS, GC/chlamydia at next visit. ROB in 2 weeks. ? ?Guadlupe Spanish, CNM ?

## 2021-07-29 ENCOUNTER — Encounter: Payer: Self-pay | Admitting: Obstetrics

## 2021-07-29 LAB — CULTURE, OB URINE

## 2021-07-29 LAB — URINE CULTURE, OB REFLEX

## 2021-08-09 ENCOUNTER — Other Ambulatory Visit: Payer: Self-pay

## 2021-08-09 ENCOUNTER — Ambulatory Visit (INDEPENDENT_AMBULATORY_CARE_PROVIDER_SITE_OTHER): Payer: Medicaid Other

## 2021-08-09 ENCOUNTER — Ambulatory Visit (INDEPENDENT_AMBULATORY_CARE_PROVIDER_SITE_OTHER): Payer: Medicaid Other | Admitting: Certified Nurse Midwife

## 2021-08-09 VITALS — BP 130/81 | HR 97 | Wt 197.9 lb

## 2021-08-09 DIAGNOSIS — Z3483 Encounter for supervision of other normal pregnancy, third trimester: Secondary | ICD-10-CM

## 2021-08-09 DIAGNOSIS — O0993 Supervision of high risk pregnancy, unspecified, third trimester: Secondary | ICD-10-CM

## 2021-08-09 DIAGNOSIS — Z3A36 36 weeks gestation of pregnancy: Secondary | ICD-10-CM

## 2021-08-09 LAB — POCT URINALYSIS DIPSTICK OB
Bilirubin, UA: NEGATIVE
Glucose, UA: NEGATIVE
Ketones, UA: NEGATIVE
Leukocytes, UA: NEGATIVE
Nitrite, UA: NEGATIVE
POC,PROTEIN,UA: NEGATIVE
Spec Grav, UA: 1.01 (ref 1.010–1.025)
Urobilinogen, UA: 0.2 E.U./dL
pH, UA: 7 (ref 5.0–8.0)

## 2021-08-09 NOTE — Patient Instructions (Signed)

## 2021-08-09 NOTE — Progress Notes (Signed)
Rob and u/s today for growth due to suboxone use.U/s report not available at this time. Per U/s tech , was told AFI 15, and growth 16th %.  Neonatal abstinence pamphlet given to pt. Discussed possible prolonged stay in NICU for baby. Discussed wkly NST until delivery due to Suboxone use. GBS and cultures today. Herbal prep handout given. Follow up 1 wk for NST and ROB with Missy .  ? ?Doreene Burke, CNM  ?

## 2021-08-11 LAB — STREP GP B NAA: Strep Gp B NAA: NEGATIVE

## 2021-08-11 LAB — GC/CHLAMYDIA PROBE AMP
Chlamydia trachomatis, NAA: POSITIVE — AB
Neisseria Gonorrhoeae by PCR: NEGATIVE

## 2021-08-12 ENCOUNTER — Other Ambulatory Visit: Payer: Self-pay | Admitting: Certified Nurse Midwife

## 2021-08-12 ENCOUNTER — Encounter: Payer: Self-pay | Admitting: Certified Nurse Midwife

## 2021-08-12 MED ORDER — AZITHROMYCIN 500 MG PO TABS: 1000.0000 mg | ORAL_TABLET | Freq: Once | ORAL | 0 refills | Status: AC

## 2021-08-12 NOTE — Progress Notes (Signed)
Positive for chlamydia, orders placed for treatment. Pt notified via my chart.  ? ?Doreene Burke, CNM  ?

## 2021-08-17 ENCOUNTER — Observation Stay
Admission: EM | Admit: 2021-08-17 | Discharge: 2021-08-17 | Disposition: A | Discharge status: Home / Self Care | Payer: Medicaid Other | Attending: Certified Nurse Midwife | Admitting: Certified Nurse Midwife

## 2021-08-17 DIAGNOSIS — R109 Unspecified abdominal pain: Secondary | ICD-10-CM | POA: Insufficient documentation

## 2021-08-17 DIAGNOSIS — Z3A37 37 weeks gestation of pregnancy: Secondary | ICD-10-CM | POA: Insufficient documentation

## 2021-08-17 DIAGNOSIS — O26893 Other specified pregnancy related conditions, third trimester: Secondary | ICD-10-CM | POA: Diagnosis not present

## 2021-08-17 DIAGNOSIS — O368330 Maternal care for abnormalities of the fetal heart rate or rhythm, third trimester, not applicable or unspecified: Secondary | ICD-10-CM | POA: Diagnosis not present

## 2021-08-17 MED ORDER — ACETAMINOPHEN 325 MG PO TABS: 650.0000 mg | ORAL_TABLET | ORAL | Status: DC | PRN

## 2021-08-17 NOTE — OB Triage Note (Signed)
? ? ?  L&D OB Triage Note ? ?SUBJECTIVE ?Amanda Barnett is a 26 y.o. [id number] female at [redacted]w[redacted]d, EDD Estimated Date of Delivery: 09/04/21 who presented to triage with complaints of  abdominal discomfort and contraction q 30 min. She feels good movement, denies vaginal bleeding and leaking of fluid.  ? ?OB History  ?Gravida Para Term Preterm AB Living  ?3 2 1 1  0 2  ?SAB IAB Ectopic Multiple Live Births  ?0 0 0 0 2  ?  ?# Outcome Date GA Lbr Len/2nd Weight Sex Delivery Anes PTL Lv  ?3 Current           ?2 Preterm 02/28/17 [redacted]w[redacted]d 05:21 / 00:45 2610 g M Vag-Spont EPI  LIV  ?   Name: Amanda Barnett  ?   Apgar1: 9  Apgar5: 9  ?1 Term 08/30/13 [redacted]w[redacted]d  3039 g M Vag-Spont  N LIV  ? ? ?Medications Prior to Admission  ?Medication Sig Dispense Refill Last Dose  ? buprenorphine-naloxone (SUBOXONE) 2-0.5 mg SUBL SL tablet Place 1 tablet under the tongue daily.   08/17/2021  ? Prenatal MV-Min-Fe Fum-FA-DHA (PRENATAL 1 PO) Take by mouth.   08/17/2021  ? sertraline (ZOLOFT) 50 MG tablet Take 1 tablet (50 mg total) by mouth daily. 30 tablet 3 08/17/2021  ? FLUoxetine (PROZAC) 20 MG capsule Take 60 mg by mouth every morning. (Patient not taking: Reported on 08/17/2021)   Not Taking  ? hydrOXYzine (ATARAX) 25 MG tablet Take 25 mg by mouth 3 (three) times daily. (Patient not taking: Reported on 08/17/2021)   Not Taking  ? ? ? OBJECTIVE ? Nursing Evaluation: ?  Resp 16   LMP 11/28/2020  ?  Findings:   irritability  ?    ? ?NST was performed and has been reviewed by me. ? ?NST INTERPRETATION: ?Category I ? ?Mode: External ?Baseline Rate (A): 125 bpm ?Variability: Moderate ?Accelerations: 15 x 15 ?Decelerations: None ?  ?  ?Contraction Frequency (min): none ? ?ASSESSMENT ?Impression:  ?1.  Pregnancy:  01/29/2021 at [redacted]w[redacted]d , EDD Estimated Date of Delivery: 09/04/21 ?2.  Reassuring fetal and maternal status ?3.  Tylenol for round ligament pain and prn  ? ?PLAN ?1. Current condition and above findings reviewed.  Reassuring fetal and maternal  condition. ?2. Discharge home with standard labor precautions given to return to L&D or call the office for problems. ?3. Continue routine prenatal care. ? ?11/04/21, CNM  ?    ?

## 2021-08-17 NOTE — OB Triage Note (Signed)
Presents with complaint of lower abdominal pressure and contractions that are irregular, states about 30 mins. Apart. Denies any bleeding or leaking of fluid.  States just finished antibiotic .  EFM applied,  ?

## 2021-08-18 ENCOUNTER — Other Ambulatory Visit: Payer: Medicaid Other

## 2021-08-18 ENCOUNTER — Other Ambulatory Visit: Payer: Self-pay

## 2021-08-18 ENCOUNTER — Ambulatory Visit (INDEPENDENT_AMBULATORY_CARE_PROVIDER_SITE_OTHER): Payer: Medicaid Other | Admitting: Obstetrics

## 2021-08-18 VITALS — BP 122/79 | HR 85 | Wt 196.0 lb

## 2021-08-18 DIAGNOSIS — O0993 Supervision of high risk pregnancy, unspecified, third trimester: Secondary | ICD-10-CM

## 2021-08-18 LAB — FETAL NONSTRESS TEST

## 2021-08-18 NOTE — Progress Notes (Addendum)
ROB at [redacted]w[redacted]d. Good fetal movement. RNST today (see note). Feeling exhausted and over being pregnant. Mood is a little better with medication. She completed her course of antibiotics for chlamydia. She has things ready for the baby at home and the car seat is in the car. In triage yesterday for contractions but was sent home. She is still having uterine irritability. Ctx palpate mild. SVE per patient request 1/30/high. Encouraged rest, warm bath, hydration. Has been drinking red raspberry leaf tea and plans to start EPO. RTC in one week for ROB/NST. ? ?Lloyd Huger, CNM ?

## 2021-08-23 ENCOUNTER — Other Ambulatory Visit: Payer: Self-pay

## 2021-08-23 ENCOUNTER — Encounter: Payer: Self-pay | Admitting: Obstetrics and Gynecology

## 2021-08-23 ENCOUNTER — Observation Stay
Admission: EM | Admit: 2021-08-23 | Discharge: 2021-08-23 | Disposition: A | Discharge status: Home / Self Care | Payer: Medicaid Other | Attending: Certified Nurse Midwife | Admitting: Certified Nurse Midwife

## 2021-08-23 DIAGNOSIS — O26893 Other specified pregnancy related conditions, third trimester: Secondary | ICD-10-CM

## 2021-08-23 DIAGNOSIS — R109 Unspecified abdominal pain: Secondary | ICD-10-CM | POA: Diagnosis not present

## 2021-08-23 DIAGNOSIS — O4703 False labor before 37 completed weeks of gestation, third trimester: Principal | ICD-10-CM | POA: Insufficient documentation

## 2021-08-23 DIAGNOSIS — Z3A38 38 weeks gestation of pregnancy: Secondary | ICD-10-CM | POA: Diagnosis not present

## 2021-08-23 LAB — RUPTURE OF MEMBRANE (ROM)PLUS: Rom Plus: NEGATIVE

## 2021-08-23 NOTE — OB Triage Note (Signed)
Pt discharged home per order.  Pt stable and ambulatory and an After Visit Summary was printed and given to the patient. Discharge education completed with patient/family including follow up instructions, appointments, and medication list. Pt received labor and bleeding precautions. Patient able to verbalize understanding, all questions fully answered upon discharge. P Pt discharged home via personal vehicle with support person. Appt scheduled for tomorrow with OB.  ?

## 2021-08-23 NOTE — Discharge Instructions (Signed)
DRINK PLENTY OF H20  

## 2021-08-23 NOTE — Progress Notes (Signed)
Pt presented to L/D triage with reported contractions that began this morning and general discomfort. Pt describes abdominal pain as intermittent, rated 3/10. Denies bleeding and reports occasional LOF with standing. Thin white discharge noted with SVE-ROM+ collected.  ?Pt reports positive fetal movement.  ?Monitors applied and assessing- initial FHT 130. VSS.  ?SVE unchanged from previous exam- will eval for labor.  ?

## 2021-08-23 NOTE — OB Triage Note (Signed)
? ? ?  L&D OB Triage Note ? ?SUBJECTIVE ?Amanda Barnett is a 26 y.o. [id number] female at [redacted]w[redacted]d, EDD Estimated Date of Delivery: 09/04/21 who presented to triage with complaints of irregular contractions and watery discharge.  ? ?OB History  ?Gravida Para Term Preterm AB Living  ?3 2 1 1  0 2  ?SAB IAB Ectopic Multiple Live Births  ?0 0 0 0 2  ?  ?# Outcome Date GA Lbr Len/2nd Weight Sex Delivery Anes PTL Lv  ?3 Current           ?2 Preterm 02/28/17 [redacted]w[redacted]d 05:21 / 00:45 2610 g M Vag-Spont EPI  LIV  ?   Name: Amanda Barnett  ?   Apgar1: 9  Apgar5: 9  ?1 Term 08/30/13 [redacted]w[redacted]d  3039 g M Vag-Spont  N LIV  ? ? ?Medications Prior to Admission  ?Medication Sig Dispense Refill Last Dose  ? buprenorphine-naloxone (SUBOXONE) 2-0.5 mg SUBL SL tablet Place 1 tablet under the tongue daily.   08/22/2021  ? FLUoxetine (PROZAC) 20 MG capsule Take 60 mg by mouth every morning.   08/23/2021  ? Prenatal MV-Min-Fe Fum-FA-DHA (PRENATAL 1 PO) Take by mouth.   08/23/2021  ? hydrOXYzine (ATARAX) 25 MG tablet Take 25 mg by mouth 3 (three) times daily. (Patient not taking: Reported on 08/17/2021)   Not Taking  ? sertraline (ZOLOFT) 50 MG tablet Take 1 tablet (50 mg total) by mouth daily. (Patient not taking: Reported on 08/23/2021) 30 tablet 3 Not Taking  ? ? ? OBJECTIVE ? Nursing Evaluation: ?  BP 136/80 (BP Location: Right Arm)   Pulse 84   Temp 98.3 ?F (36.8 ?C) (Oral)   Resp 18   Ht 5\' 1"  (1.549 m)   Wt 89.4 kg   LMP 11/28/2020   BMI 37.22 kg/m?  ?  Findings:   ROM plus negative , irregular contractions  ? ?    ? ?NST was performed and has been reviewed by me. ? ?NST INTERPRETATION: ?Category I ? ?Mode: External ?Baseline Rate (A): 130 bpm (fht) ? Moderate variably ?Accelerations present ?Declarations absent ? ?Ctx - irregular , mild ?  ?ASSESSMENT ?Impression:  ?1.  Pregnancy:  at [redacted]w[redacted]d , EDD Estimated Date of Delivery: 09/04/21 ?2.  Reassuring fetal and maternal status ?3.  No cervical change  ?4 ROM plus negative  ? ?PLAN ?1.  Current condition and above findings reviewed.  Reassuring fetal and maternal condition. ?2. Discharge home with standard labor precautions given to return to L&D or call the office for problems. ?3. Continue routine prenatal care as scheduled in office tomorrow.  ? ?[redacted]w[redacted]d, CNM  ?    ?

## 2021-08-24 ENCOUNTER — Ambulatory Visit (INDEPENDENT_AMBULATORY_CARE_PROVIDER_SITE_OTHER): Payer: Medicaid Other | Admitting: Certified Nurse Midwife

## 2021-08-24 ENCOUNTER — Encounter: Payer: Self-pay | Admitting: Certified Nurse Midwife

## 2021-08-24 VITALS — BP 119/78 | HR 96 | Wt 199.5 lb

## 2021-08-24 DIAGNOSIS — O0993 Supervision of high risk pregnancy, unspecified, third trimester: Secondary | ICD-10-CM

## 2021-08-24 DIAGNOSIS — Z3A38 38 weeks gestation of pregnancy: Secondary | ICD-10-CM

## 2021-08-24 NOTE — Progress Notes (Signed)
Amanda Barnett , doing well. Feeling good movement . NST was done yesterday at the hospital and was reactive. SVE per pt request, unable to reach, presenting part floating. Leopolds suggest vertex. Pt given information [employer]. Labor precautions reviewed. Follow up 1 wk for NST and Amanda Barnett.  ? ?Doreene Burke, CNM  ?

## 2021-08-24 NOTE — Patient Instructions (Signed)
Braxton Hicks Contractions Contractions of the uterus can occur throughout pregnancy, but they are not always a sign that you are in labor. You may have practice contractions called Braxton Hicks contractions. These false labor contractions are sometimes confused with true labor. What are Braxton Hicks contractions? Braxton Hicks contractions are tightening movements that occur in the muscles of the uterus before labor. Unlike true labor contractions, these contractions do not result in opening (dilation) and thinning of the lowest part of the uterus (cervix). Toward the end of pregnancy (32-34 weeks), Braxton Hicks contractions can happen more often and may become stronger. These contractions are sometimes difficult to tell apart from true labor because they can be very uncomfortable. How to tell the difference between true labor and false labor True labor Contractions last 30-70 seconds. Contractions become very regular. Discomfort is usually felt in the top of the uterus, and it spreads to the lower abdomen and low back. Contractions do not go away with walking. Contractions usually become stronger and more frequent. The cervix dilates and gets thinner. False labor Contractions are usually shorter, weaker, and farther apart than true labor contractions. Contractions are usually irregular. Contractions are often felt in the front of the lower abdomen and in the groin. Contractions may go away when you walk around or change positions while lying down. The cervix usually does not dilate or become thin. Sometimes, the only way to tell if you are in true labor is for your health care provider to look for changes in your cervix. Your health care provider will do a physical exam and may monitor your contractions. If you are in true labor, your health care provider will send you home with instructions about when to return to the hospital. You may continue to have Braxton Hicks contractions until you  go into true labor. Follow these instructions at home:  Take over-the-counter and prescription medicines only as told by your health care provider. If Braxton Hicks contractions are making you uncomfortable: Change your position from lying down or resting to walking, or change from walking to resting. Sit and rest in a tub of warm water. Drink enough fluid to keep your urine pale yellow. Dehydration may cause these contractions. Do slow and deep breathing several times an hour. Keep all follow-up visits. This is important. Contact a health care provider if: You have a fever. You have continuous pain in your abdomen. Your contractions become stronger, more regular, and closer together. You pass blood-tinged mucus. Get help right away if: You have fluid leaking or gushing from your vagina. You have bright red blood coming from your vagina. Your baby is not moving inside you as much as it used to. Summary You may have practice contractions called Braxton Hicks contractions. These false labor contractions are sometimes confused with true labor. Braxton Hicks contractions are usually shorter, weaker, farther apart, and less regular than true labor contractions. True labor contractions usually become stronger, more regular, and more frequent. Manage discomfort from Braxton Hicks contractions by changing position, resting in a warm bath, practicing deep breathing, and drinking plenty of water. Keep all follow-up visits. Contact your health care provider if your contractions become stronger, more regular, and closer together. This information is not intended to replace advice given to you by your health care provider. Make sure you discuss any questions you have with your health care provider. Document Revised: 03/22/2020 Document Reviewed: 03/22/2020 Elsevier Patient Education  2022 [employer]  

## 2021-09-01 ENCOUNTER — Other Ambulatory Visit: Payer: Self-pay

## 2021-09-01 ENCOUNTER — Other Ambulatory Visit: Payer: Medicaid Other

## 2021-09-01 ENCOUNTER — Encounter: Payer: Self-pay | Admitting: Obstetrics and Gynecology

## 2021-09-01 ENCOUNTER — Observation Stay
Admission: EM | Admit: 2021-09-01 | Discharge: 2021-09-01 | Disposition: A | Discharge status: Home / Self Care | Payer: Medicaid Other | Attending: Obstetrics | Admitting: Obstetrics

## 2021-09-01 ENCOUNTER — Encounter: Payer: Medicaid Other | Admitting: Obstetrics

## 2021-09-01 DIAGNOSIS — Z3A39 39 weeks gestation of pregnancy: Secondary | ICD-10-CM | POA: Insufficient documentation

## 2021-09-01 DIAGNOSIS — O471 False labor at or after 37 completed weeks of gestation: Secondary | ICD-10-CM | POA: Diagnosis present

## 2021-09-01 NOTE — Progress Notes (Signed)
Pt discharged home per M.Swanson, CNM order.  Pt stable and ambulatory and an After Visit Summary was printed and given to the patient. Pt scheduled for induction of labor for Friday 09/09/2021 at 0800. Discharge education completed with patient/family including follow up instructions, appointments, and medication list. Pt received labor and bleeding precautions. Patient able to verbalize understanding, all questions fully answered upon discharge. Patient instructed to return to ED, call 911, or call MD for any changes in condition. Pt discharged home via personal vehicle with support person.  ? ? ?

## 2021-09-01 NOTE — Discharge Instructions (Signed)
INDUCTION OF LABOR SCHEDULED FOR Friday 09/09/2021 at 0800. Please enter through the Emergency Department, they will escort you to Labor and Delivery. You are allowed 2 visitors in Labor and Delivery over the age of 32. If you have any questions or concerns please call [facility] or L&D unit [phone].  ?

## 2021-09-01 NOTE — OB Triage Note (Signed)
Pt Amanda Barnett 26 y.o. presents to labor and delivery triage reporting ctx  . Pt is a [id number] at [redacted]w[redacted]d . Pt denies signs and symptons consistent with rupture of membranes or active vaginal bleeding. Pt reports irregular contractions yesterday and during the night but not currently. Pt endorses positive fetal movement. Pt had an ROB visit this morning but stated, "I rather come to the hospital because I want to have this baby, I am due this Saturday." External FM and TOCO applied to non-tender abdomen and assessing. Initial FHR 130. Vital signs obtained and within normal limits. Provider notified of pt. ? ?

## 2021-09-01 NOTE — OB Triage Note (Signed)
LABOR & DELIVERY OB TRIAGE NOTE ? ?SUBJECTIVE ? ?HPI ?Amanda Barnett is a 26 y.o. [id number] at [redacted]w[redacted]d who presents to Labor & Delivery for contractions last night. She denies LOF and vaginal bleeding. +FM. ? ?OB History   ? ? Gravida  ?3  ? Para  ?2  ? Term  ?1  ? Preterm  ?1  ? AB  ?   ? Living  ?2  ?  ? ? SAB  ?   ? IAB  ?   ? Ectopic  ?   ? Multiple  ?0  ? Live Births  ?2  ?   ?  ?  ? ? ?Scheduled Meds: ?Continuous Infusions: ?PRN Meds:. ? ?OBJECTIVE ? ?LMP 11/28/2020  ? ?NST ?I reviewed the NST and it was reactive. ? ?Baseline: 125 ?Variability: moderate ?Accelerations: present, 15x15 ?Decelerations:none ?Toco: no contractions ?Category 1 ? ?Cervical Exam ?Dilation: 1 ?Effacement (%): 50 ?Cervical Position: Posterior ?Station: Ballotable ?Presentation: Vertex ?Exam by:: Jones Bales  ? ?ASSESSMENT ?Impression ? ?1) Pregnancy at [id number], [redacted]w[redacted]d, Estimated Date of Delivery: 09/04/21 ?2) Reassuring maternal/fetal status ?3) Not currently in labor ? ? ?PLAN ?1) Discharge home with standard labor/return precautions ?2) Encourage rest, hydration, warm bath, Tylenol PM if desired ?3) Keep scheduled ROB visit ? ?IOL scheduled for 09/09/21 @ 0800. ? ?Lloyd Huger, CNM ?

## 2021-09-09 ENCOUNTER — Encounter: Payer: Self-pay | Admitting: Obstetrics

## 2021-09-09 ENCOUNTER — Inpatient Hospital Stay
Admission: RE | Admit: 2021-09-09 | Discharge: 2021-09-12 | DRG: 806 | Disposition: A | Discharge status: Home / Self Care | Payer: Medicaid Other | Attending: Certified Nurse Midwife | Admitting: Certified Nurse Midwife

## 2021-09-09 ENCOUNTER — Other Ambulatory Visit: Payer: Self-pay

## 2021-09-09 ENCOUNTER — Inpatient Hospital Stay: Payer: Medicaid Other | Admitting: Anesthesiology

## 2021-09-09 ENCOUNTER — Other Ambulatory Visit: Payer: Self-pay | Admitting: Obstetrics

## 2021-09-09 DIAGNOSIS — F1210 Cannabis abuse, uncomplicated: Secondary | ICD-10-CM | POA: Diagnosis not present

## 2021-09-09 DIAGNOSIS — Z3A40 40 weeks gestation of pregnancy: Secondary | ICD-10-CM

## 2021-09-09 DIAGNOSIS — F1120 Opioid dependence, uncomplicated: Secondary | ICD-10-CM

## 2021-09-09 DIAGNOSIS — F1110 Opioid abuse, uncomplicated: Secondary | ICD-10-CM | POA: Diagnosis present

## 2021-09-09 DIAGNOSIS — O99324 Drug use complicating childbirth: Secondary | ICD-10-CM | POA: Diagnosis not present

## 2021-09-09 DIAGNOSIS — F1190 Opioid use, unspecified, uncomplicated: Secondary | ICD-10-CM | POA: Insufficient documentation

## 2021-09-09 DIAGNOSIS — F1290 Cannabis use, unspecified, uncomplicated: Secondary | ICD-10-CM | POA: Diagnosis present

## 2021-09-09 DIAGNOSIS — O480 Post-term pregnancy: Principal | ICD-10-CM | POA: Diagnosis present

## 2021-09-09 LAB — CBC
HCT: 34.5 % — ABNORMAL LOW (ref 36.0–46.0)
Hemoglobin: 11.5 g/dL — ABNORMAL LOW (ref 12.0–15.0)
MCH: 29 pg (ref 26.0–34.0)
MCHC: 33.3 g/dL (ref 30.0–36.0)
MCV: 87.1 fL (ref 80.0–100.0)
Platelets: 260 10*3/uL (ref 150–400)
RBC: 3.96 MIL/uL (ref 3.87–5.11)
RDW: 13.2 % (ref 11.5–15.5)
WBC: 5.8 10*3/uL (ref 4.0–10.5)
nRBC: 0 % (ref 0.0–0.2)

## 2021-09-09 LAB — URINE DRUG SCREEN, QUALITATIVE (ARMC ONLY)
Amphetamines, Ur Screen: NOT DETECTED
Barbiturates, Ur Screen: NOT DETECTED
Benzodiazepine, Ur Scrn: NOT DETECTED
Cannabinoid 50 Ng, Ur ~~LOC~~: POSITIVE — AB
Cocaine Metabolite,Ur ~~LOC~~: NOT DETECTED
MDMA (Ecstasy)Ur Screen: NOT DETECTED
Methadone Scn, Ur: NOT DETECTED
Opiate, Ur Screen: NOT DETECTED
Phencyclidine (PCP) Ur S: NOT DETECTED
Tricyclic, Ur Screen: NOT DETECTED

## 2021-09-09 LAB — TYPE AND SCREEN
ABO/RH(D): AB POS
Antibody Screen: NEGATIVE

## 2021-09-09 MED ORDER — LIDOCAINE-EPINEPHRINE (PF) 1.5 %-1:200000 IJ SOLN
INTRAMUSCULAR | Status: DC | PRN
  Administered 2021-09-09: 3 mL via EPIDURAL

## 2021-09-09 MED ORDER — LACTATED RINGERS IV SOLN: INTRAVENOUS | Status: DC

## 2021-09-09 MED ORDER — ONDANSETRON HCL 4 MG/2ML IJ SOLN
4.0000 mg | Freq: Four times a day (QID) | INTRAMUSCULAR | Status: DC | PRN
  Administered 2021-09-10 (×2): 4 mg via INTRAVENOUS
  Filled 2021-09-09 (×2): qty 2

## 2021-09-09 MED ORDER — EPHEDRINE 5 MG/ML INJ
10.0000 mg | INTRAVENOUS | Status: DC | PRN
Start: 2021-09-09 — End: 2021-09-10
  Filled 2021-09-09: qty 2

## 2021-09-09 MED ORDER — OXYTOCIN-SODIUM CHLORIDE 30-0.9 UT/500ML-% IV SOLN
2.5000 [IU]/h | INTRAVENOUS | Status: DC
  Administered 2021-09-10: 2.5 [IU]/h via INTRAVENOUS
  Filled 2021-09-09: qty 500

## 2021-09-09 MED ORDER — FLUOXETINE HCL 10 MG PO CAPS
60.0000 mg | ORAL_CAPSULE | Freq: Every morning | ORAL | Status: DC
  Administered 2021-09-10 – 2021-09-12 (×3): 60 mg via ORAL
  Filled 2021-09-09: qty 3
  Filled 2021-09-09 (×3): qty 6

## 2021-09-09 MED ORDER — OXYTOCIN 10 UNIT/ML IJ SOLN
INTRAMUSCULAR | Status: AC
  Filled 2021-09-09: qty 2

## 2021-09-09 MED ORDER — PHENYLEPHRINE 40 MCG/ML (10ML) SYRINGE FOR IV PUSH (FOR BLOOD PRESSURE SUPPORT)
80.0000 ug | PREFILLED_SYRINGE | INTRAVENOUS | Status: DC | PRN
Start: 2021-09-09 — End: 2021-09-10
  Filled 2021-09-09: qty 10

## 2021-09-09 MED ORDER — LIDOCAINE HCL (PF) 1 % IJ SOLN
30.0000 mL | INTRAMUSCULAR | Status: DC | PRN
  Filled 2021-09-09: qty 30

## 2021-09-09 MED ORDER — MISOPROSTOL 50MCG HALF TABLET
50.0000 ug | ORAL_TABLET | ORAL | Status: DC | PRN
Start: 2021-09-09 — End: 2021-09-10
  Filled 2021-09-09 (×3): qty 1

## 2021-09-09 MED ORDER — OXYTOCIN-SODIUM CHLORIDE 30-0.9 UT/500ML-% IV SOLN
1.0000 m[IU]/min | INTRAVENOUS | Status: DC
  Administered 2021-09-09: 1 m[IU]/min via INTRAVENOUS
  Filled 2021-09-09: qty 500

## 2021-09-09 MED ORDER — HYDROXYZINE HCL 25 MG PO TABS
50.0000 mg | ORAL_TABLET | Freq: Four times a day (QID) | ORAL | Status: DC | PRN
  Filled 2021-09-09: qty 1

## 2021-09-09 MED ORDER — BUTORPHANOL TARTRATE 1 MG/ML IJ SOLN: 1.0000 mg | INTRAMUSCULAR | Status: DC | PRN

## 2021-09-09 MED ORDER — OXYTOCIN BOLUS FROM INFUSION
333.0000 mL | Freq: Once | INTRAVENOUS | Status: AC
  Administered 2021-09-10: 333 mL via INTRAVENOUS

## 2021-09-09 MED ORDER — PHENYLEPHRINE 40 MCG/ML (10ML) SYRINGE FOR IV PUSH (FOR BLOOD PRESSURE SUPPORT)
80.0000 ug | PREFILLED_SYRINGE | INTRAVENOUS | Status: DC | PRN
  Filled 2021-09-09: qty 10

## 2021-09-09 MED ORDER — DIPHENHYDRAMINE HCL 50 MG/ML IJ SOLN: 12.5000 mg | INTRAMUSCULAR | Status: DC | PRN

## 2021-09-09 MED ORDER — SODIUM CHLORIDE 0.9 % IV SOLN
INTRAVENOUS | Status: DC | PRN
  Administered 2021-09-09 (×2): 5 mL via EPIDURAL

## 2021-09-09 MED ORDER — LACTATED RINGERS IV SOLN
500.0000 mL | Freq: Once | INTRAVENOUS | Status: AC
  Administered 2021-09-09: 500 mL via INTRAVENOUS

## 2021-09-09 MED ORDER — FENTANYL-BUPIVACAINE-NACL 0.5-0.125-0.9 MG/250ML-% EP SOLN
12.0000 mL/h | EPIDURAL | Status: DC | PRN
  Administered 2021-09-09 – 2021-09-10 (×2): 12 mL/h via EPIDURAL
  Filled 2021-09-09 (×2): qty 250

## 2021-09-09 MED ORDER — LACTATED RINGERS IV SOLN: 500.0000 mL | INTRAVENOUS | Status: DC | PRN

## 2021-09-09 MED ORDER — ACETAMINOPHEN 325 MG PO TABS
650.0000 mg | ORAL_TABLET | ORAL | Status: DC | PRN
Start: 2021-09-09 — End: 2021-09-10
  Filled 2021-09-09 (×2): qty 2

## 2021-09-09 MED ORDER — BUPRENORPHINE HCL-NALOXONE HCL 2-0.5 MG SL SUBL
1.0000 | SUBLINGUAL_TABLET | Freq: Two times a day (BID) | SUBLINGUAL | Status: DC
  Administered 2021-09-10 – 2021-09-12 (×5): 1 via SUBLINGUAL
  Filled 2021-09-09 (×5): qty 1

## 2021-09-09 MED ORDER — LIDOCAINE HCL (PF) 1 % IJ SOLN
INTRAMUSCULAR | Status: DC | PRN
  Administered 2021-09-09: 3 mL via SUBCUTANEOUS

## 2021-09-09 MED ORDER — SOD CITRATE-CITRIC ACID 500-334 MG/5ML PO SOLN: 30.0000 mL | ORAL | Status: DC | PRN

## 2021-09-09 MED ORDER — MISOPROSTOL 200 MCG PO TABS
ORAL_TABLET | ORAL | Status: AC
  Administered 2021-09-09: 50 ug via VAGINAL
  Filled 2021-09-09: qty 4

## 2021-09-09 MED ORDER — EPHEDRINE 5 MG/ML INJ
10.0000 mg | INTRAVENOUS | Status: DC | PRN
  Filled 2021-09-09: qty 2

## 2021-09-09 MED ORDER — AMMONIA AROMATIC IN INHA
RESPIRATORY_TRACT | Status: AC
  Filled 2021-09-09: qty 10

## 2021-09-09 NOTE — Anesthesia Procedure Notes (Signed)
Epidural ?Patient location during procedure: OB ?Start time: 09/09/2021 7:59 PM ?End time: 09/09/2021 8:04 PM ? ?Staffing ?Anesthesiologist: Lenard Simmer, MD ?Performed: anesthesiologist  ? ?Preanesthetic Checklist ?Completed: patient identified, IV checked, site marked, risks and benefits discussed, surgical consent, monitors and equipment checked, pre-op evaluation and timeout performed ? ?Epidural ?Patient position: sitting ?Prep: ChloraPrep ?Patient monitoring: heart rate, continuous pulse ox and blood pressure ?Approach: midline ?Location: L3-L4 ?Injection technique: LOR saline ? ?Needle:  ?Needle type: Tuohy  ?Needle gauge: 17 G ?Needle length: 9 cm and 9 ?Needle insertion depth: 5 cm ?Catheter type: closed end flexible ?Catheter size: 19 Gauge ?Catheter at skin depth: 10 cm ?Test dose: negative and 1.5% lidocaine with Epi 1:200 K ? ?Assessment ?Sensory level: T10 ?Events: blood not aspirated, injection not painful, no injection resistance, no paresthesia and negative IV test ? ?Additional Notes ?1st attempt ?Pt. Evaluated and documentation done after procedure finished. ?Patient identified. Risks/Benefits/Options discussed with patient including but not limited to bleeding, infection, nerve damage, paralysis, failed block, incomplete pain control, headache, blood pressure changes, nausea, vomiting, reactions to medication both or allergic, itching and postpartum back pain. Confirmed with bedside nurse the patient's most recent platelet count. Confirmed with patient that they are not currently taking any anticoagulation, have any bleeding history or any family history of bleeding disorders. Patient expressed understanding and wished to proceed. All questions were answered. Sterile technique was used throughout the entire procedure. Please see nursing notes for vital signs. Test dose was given through epidural catheter and negative prior to continuing to dose epidural or start infusion. Warning signs of high  block given to the patient including shortness of breath, tingling/numbness in hands, complete motor block, or any concerning symptoms with instructions to call for help. Patient was given instructions on fall risk and not to get out of bed. All questions and concerns addressed with instructions to call with any issues or inadequate analgesia.   ? ?Patient tolerated the insertion well without immediate complications.Reason for block:procedure for pain ? ? ? ?

## 2021-09-09 NOTE — Anesthesia Preprocedure Evaluation (Addendum)
Anesthesia Evaluation  ?Patient identified by MRN, date of birth, ID band ?Patient awake ? ? ? ?Reviewed: ?H&P , NPO status , Patient's Chart, lab work & pertinent test results ? ?History of Anesthesia Complications ?Negative for: history of anesthetic complications ? ?Airway ?Mallampati: II ? ?TM Distance: <3 FB ?Neck ROM: full ? ? ? Dental ?no notable dental hx. ? ?  ?Pulmonary ?neg shortness of breath, asthma (as a child) , neg sleep apnea, neg recent URI, Current Smoker, former smoker,  ?  ?Pulmonary exam normal ? ? ? ? ? ? ? Cardiovascular ?negative cardio ROS ?Normal cardiovascular exam ? ? ?  ?Neuro/Psych ?negative neurological ROS ? negative psych ROS  ? GI/Hepatic ?Neg liver ROS, GERD  ,  ?Endo/Other  ?negative endocrine ROS ? Renal/GU ?negative Renal ROS  ?negative genitourinary ?  ?Musculoskeletal ? ? Abdominal ?  ?Peds ? Hematology ?negative hematology ROS ?(+)   ?Anesthesia Other Findings ?Past Medical History: ?No date: Abdominal pain, recurrent ?No date: Amenorrhea ?No date: Asthma ?    Comment:  as a child ?No date: GERD (gastroesophageal reflux disease) ?No date: Infantile colic ?    Comment:  resolved ? ? Reproductive/Obstetrics ?(+) Pregnancy ? ?  ? ? ? ? ? ? ? ? ? ? ? ? ? ?  ?  ? ? ? ? ? ? ? ?Anesthesia Physical ? ?Anesthesia Plan ? ?ASA: 2 ? ?Anesthesia Plan: General  ? ?Post-op Pain Management: Tylenol PO (pre-op)*, Celebrex PO (pre-op)* and Gabapentin PO (pre-op)*  ? ?Induction: Intravenous ? ?PONV Risk Score and Plan: 3 and Ondansetron, Dexamethasone and Midazolam ? ?Airway Management Planned: Oral ETT ? ?Additional Equipment:  ? ?Intra-op Plan:  ? ?Post-operative Plan:  ? ?Informed Consent:  ? ?Plan Discussed with: Anesthesiologist and CRNA ? ?Anesthesia Plan Comments:   ? ? ? ? ? ?Anesthesia Quick Evaluation ? ?

## 2021-09-09 NOTE — Progress Notes (Signed)
LABOR NOTE  ? ?SUBJECTIVE:  ? ?Amanda Barnett is a 26 y.o.GP@ at [redacted]w[redacted]d who presented to L&D for induction of labor. She has had one dose of cytotec and is now getting Pitocin. ? ?Analgesia: Plans Epidural ? ?OBJECTIVE:  ?BP 114/67 (BP Location: Right Arm)   Pulse 79   Temp 98.5 ?F (36.9 ?C) (Oral)   Resp 18   Ht 5\' 1"  (1.549 m)   Wt 90.3 kg   LMP 11/28/2020   BMI 37.60 kg/m?  ?Total I/O ?In: 731.5 [I.V.:731.5] ?Out: -  ? ?SVE:   Dilation: 3 ?Effacement (%): 70 ?Station: -3 ?Exam by:: M.Sharian Delia, CNM ?CONTRACTIONS: regular, every 3 minutes ?FHR: Fetal heart tracing reviewed. ?Baseline: 140 ?Variability: moderate ?Accelerations: present - 15x15 ?Decelerations:none ?Category 1 ? ?Labs: ?Lab Results  ?Component Value Date  ? WBC 5.8 09/09/2021  ? HGB 11.5 (L) 09/09/2021  ? HCT 34.5 (L) 09/09/2021  ? MCV 87.1 09/09/2021  ? PLT 260 09/09/2021  ? ? ?ASSESSMENT: ?1) Induction of labor due to postdates,  progressing well on pitocin ?    Coping: well. Good support ?    Membranes: intact ? ?Principal Problem: ?  Labor and delivery, indication for care ?Active Problems: ?  Opioid use disorder ?  Pregnancy complicated by subutex maintenance, antepartum ([place]) ? ? ?PLAN: ?Continue present management ?Anticipate NSVD ? ?Lloyd Huger, CNM ?09/09/2021 ?5:49 PM ? ?  ?

## 2021-09-09 NOTE — H&P (Signed)
?History and Physical ? ? ?HPI ? ?Amanda Barnett is a 26 y.o. [id number] at [redacted]w[redacted]d Estimated Date of Delivery: 09/04/21 who is being admitted for induction of labor. Her pregnancy was complicated by suboxone maintenance and marijuana use. ? ? ?OB History ? ?OB History  ?Gravida Para Term Preterm AB Living  ?3 2 1 1  0 2  ?SAB IAB Ectopic Multiple Live Births  ?0 0 0 0 2  ?  ?# Outcome Date GA Lbr Len/2nd Weight Sex Delivery Anes PTL Lv  ?3 Current           ?2 Preterm 02/28/17 [redacted]w[redacted]d 05:21 / 00:45 2610 g M Vag-Spont EPI  LIV  ?   Name: Amanda Barnett  ?   Apgar1: 9  Apgar5: 9  ?1 Term 08/30/13 [redacted]w[redacted]d  3039 g M Vag-Spont  N LIV  ? ? ?PROBLEM LIST ? ?Pregnancy complications or risks: ?Patient Active Problem List  ? Diagnosis Date Noted  ? Opioid use disorder 09/09/2021  ? Pregnancy complicated by subutex maintenance, antepartum ([facility]) 09/09/2021  ? Labor and delivery, indication for care 07/23/2021  ? Abnormal uterine bleeding 03/12/2019  ? Suboxone maintenance treatment complicating pregnancy, antepartum ([facility]) 09/13/2016  ? Anxiety 07/03/2014  ? Depression 07/03/2014  ? ? ?Prenatal labs and studies: ?ABO, Rh: --/--/AB POS (04/14 3833) ?Antibody: NEG (04/14 0933) ?Rubella: 3.86 (09/29 1128) ?RPR: Non Reactive (01/16 1011)  ?HBsAg: Negative (09/29 1128)  ?HIV: Non Reactive (09/29 1128)  ?XOV:ANVBTYOM/-- (03/14 1633) ? ? ?Past Medical History:  ?Diagnosis Date  ? Abdominal pain, recurrent   ? Amenorrhea   ? Asthma   ? as a child  ? GERD (gastroesophageal reflux disease)   ? Infantile colic   ? resolved  ? ? ? ?Past Surgical History:  ?Procedure Laterality Date  ? CHOLECYSTECTOMY    ? RHINOPLASTY N/A 01/07/2015  ? Procedure: RHINOPLASTY;  Surgeon: Vernie Murders, MD;  Location: [facility];  Service: ENT;  Laterality: N/A;  ? RHINOPLASTY    ? SEPTOPLASTY    ? SEPTOPLASTY N/A 01/07/2015  ? Procedure: SEPTOPLASTY;  Surgeon: Vernie Murders, MD;  Location: [facility];  Service: ENT;  Laterality: N/A;  GAVE DISK TO  CECE 8-10 KP  ? ? ? ?Medications   ?Current Meds  ?Medication Sig  ? buprenorphine-naloxone (SUBOXONE) 2-0.5 mg SUBL SL tablet Place 1 tablet under the tongue daily.  ? FLUoxetine (PROZAC) 20 MG capsule Take 60 mg by mouth every morning.  ?   ?  ?Allergies ? ?Latex ? ?Review of Systems ? ?Pertinent items are noted in HPI. ? ?Physical Exam ? ?BP 120/81 (BP Location: Left Arm)   Pulse (!) 109   Temp 98 ?F (36.7 ?C) (Oral)   Resp 18   Ht 5\' 1"  (1.549 m)   Wt 90.3 kg   LMP 11/28/2020   BMI 37.60 kg/m?  ? ?Lungs:  CTA B ?Cardio: RRR without M/R/G ?Abd: Soft, gravid, NT ?Presentation: cephalic ?  ?CERVIX: Dilation: 1.5 ?Effacement (%): 60 ?Cervical Position: Posterior ?Station: -3 ?Presentation: Vertex ?Exam by:: Patsy Lager ? ?See Prenatal records for more detailed PE. ? ?  ? ?FHR:  ?Baseline: 115 ?Variability: moderate ?Accelerations: present - 15x15 ?Decelerations: absent ?Toco: regular, every 2 minutes, mild ?Category 1 ? ?Test Results ? ?Results for orders placed or performed during the hospital encounter of 09/09/21 (from the past 24 hour(s))  ?CBC     Status: Abnormal  ? Collection Time: 09/09/21  9:33 AM  ?Result Value Ref Range  ?  WBC 5.8 4.0 - 10.5 K/uL  ? RBC 3.96 3.87 - 5.11 MIL/uL  ? Hemoglobin 11.5 (L) 12.0 - 15.0 g/dL  ? HCT 34.5 (L) 36.0 - 46.0 %  ? MCV 87.1 80.0 - 100.0 fL  ? MCH 29.0 26.0 - 34.0 pg  ? MCHC 33.3 30.0 - 36.0 g/dL  ? RDW 13.2 11.5 - 15.5 %  ? Platelets 260 150 - 400 K/uL  ? nRBC 0.0 0.0 - 0.2 %  ?Type and screen     Status: None  ? Collection Time: 09/09/21  9:33 AM  ?Result Value Ref Range  ? ABO/RH(D) AB POS   ? Antibody Screen NEG   ? Sample Expiration    ?  09/12/2021,2359 ?Performed at [facility], [address], [place], [place] [address] ?  ?Urine Drug Screen, Qualitative ([place] only)     Status: Abnormal  ? Collection Time: 09/09/21  9:33 AM  ?Result Value Ref Range  ? Tricyclic, Ur Screen NONE DETECTED NONE DETECTED  ? Amphetamines, Ur Screen NONE DETECTED  NONE DETECTED  ? MDMA (Ecstasy)Ur Screen NONE DETECTED NONE DETECTED  ? Cocaine Metabolite,Ur [place] NONE DETECTED NONE DETECTED  ? Opiate, Ur Screen NONE DETECTED NONE DETECTED  ? Phencyclidine (PCP) Ur S NONE DETECTED NONE DETECTED  ? Cannabinoid 50 Ng, Ur [place] POSITIVE (A) NONE DETECTED  ? Barbiturates, Ur Screen NONE DETECTED NONE DETECTED  ? Benzodiazepine, Ur Scrn NONE DETECTED NONE DETECTED  ? Methadone Scn, Ur NONE DETECTED NONE DETECTED  ? ?Group B Strep negative ? ?Assessment ? ? [name] at [redacted]w[redacted]d Estimated Date of Delivery: 09/04/21  ?Reassuring maternal/fetal status. ? ?Patient Active Problem List  ? Diagnosis Date Noted  ? Opioid use disorder 09/09/2021  ? Pregnancy complicated by subutex maintenance, antepartum ([place]) 09/09/2021  ? Labor and delivery, indication for care 07/23/2021  ? Abnormal uterine bleeding 03/12/2019  ? Suboxone maintenance treatment complicating pregnancy, antepartum ([place]) 09/13/2016  ? Anxiety 07/03/2014  ? Depression 07/03/2014  ? ? ?Plan ? ?1. Admit to L&D for cervical ripening and IOL. Cytotec placed at 0945. ?2. EFM: Continuous -- Category 1 ?3. Pharmacologic pain relief if desired.   ?4. Admission labs and UDS ?5. Anticipate NSVD ?6. MD notified of admission ? ?Lloyd Huger, CNM ?09/09/2021 ?12:02 PM  ? ?

## 2021-09-09 NOTE — OB Triage Note (Signed)
LABOR NOTE  ? ?SUBJECTIVE:  ? ?Amanda Barnett is a 26 y.o.GP@ at [redacted]w[redacted]d who presented to L&D for induction. She is comfortable with her epidural. ? ?Analgesia: Epidural ? ?OBJECTIVE:  ?BP (!) 125/50   Pulse 79   Temp 98.3 ?F (36.8 ?C) (Oral)   Resp 16   Ht 5\' 1"  (1.549 m)   Wt 90.3 kg   LMP 11/28/2020   SpO2 98%   BMI 37.60 kg/m?  ?No intake/output data recorded. ? ?SVE:   Dilation: 4 ?Effacement (%): 70 ?Station: -3 ?Exam by:: [email or web address], CNM ?CONTRACTIONS: regular, every 2-3 minutes ?FHR: Fetal heart tracing reviewed. ?Baseline: 135 ?Variability: moderate ?Accelerations: present - 15x15 ?Decelerations:none ?Category 1 ? ?Labs: ?Lab Results  ?Component Value Date  ? WBC 5.8 09/09/2021  ? HGB 11.5 (L) 09/09/2021  ? HCT 34.5 (L) 09/09/2021  ? MCV 87.1 09/09/2021  ? PLT 260 09/09/2021  ? ? ?ASSESSMENT: ?1) Induction of labor due to postdates,  progressing well on pitocin ?    Coping: well ?    Membranes: intact ? ?Principal Problem: ?  Labor and delivery, indication for care ?Active Problems: ?  Opioid use disorder ?  Pregnancy complicated by subutex maintenance, antepartum ([facility]) ? ? ?PLAN: ?Continue present management ?Anticipate NSVD ? ? ?09/11/2021, CNM ?09/09/2021 ?8:35 PM ? ?  ?

## 2021-09-10 ENCOUNTER — Encounter
Admission: RE | Disposition: A | Discharge status: Home / Self Care | Payer: Self-pay | Source: Home / Self Care | Attending: Obstetrics

## 2021-09-10 ENCOUNTER — Encounter: Payer: Self-pay | Admitting: Obstetrics and Gynecology

## 2021-09-10 DIAGNOSIS — F1210 Cannabis abuse, uncomplicated: Secondary | ICD-10-CM

## 2021-09-10 DIAGNOSIS — O99324 Drug use complicating childbirth: Secondary | ICD-10-CM | POA: Diagnosis not present

## 2021-09-10 DIAGNOSIS — F1110 Opioid abuse, uncomplicated: Secondary | ICD-10-CM

## 2021-09-10 DIAGNOSIS — O480 Post-term pregnancy: Secondary | ICD-10-CM | POA: Diagnosis not present

## 2021-09-10 DIAGNOSIS — Z3A40 40 weeks gestation of pregnancy: Secondary | ICD-10-CM

## 2021-09-10 LAB — RPR: RPR Ser Ql: NONREACTIVE

## 2021-09-10 SURGERY — LIGATION, FALLOPIAN TUBE, POSTPARTUM
Anesthesia: Choice

## 2021-09-10 MED ORDER — OXYCODONE-ACETAMINOPHEN 5-325 MG PO TABS: 1.0000 | ORAL_TABLET | ORAL | Status: DC | PRN

## 2021-09-10 MED ORDER — TETANUS-DIPHTH-ACELL PERTUSSIS 5-2.5-18.5 LF-MCG/0.5 IM SUSY
0.5000 mL | PREFILLED_SYRINGE | Freq: Once | INTRAMUSCULAR | Status: DC
  Filled 2021-09-10: qty 0.5

## 2021-09-10 MED ORDER — SIMETHICONE 80 MG PO CHEW: 80.0000 mg | CHEWABLE_TABLET | ORAL | Status: DC | PRN

## 2021-09-10 MED ORDER — CALCIUM CARBONATE ANTACID 500 MG PO CHEW
2.0000 | CHEWABLE_TABLET | Freq: Once | ORAL | Status: AC
  Administered 2021-09-10: 400 mg via ORAL
  Filled 2021-09-10: qty 2

## 2021-09-10 MED ORDER — DIPHENHYDRAMINE HCL 25 MG PO CAPS: 25.0000 mg | ORAL_CAPSULE | Freq: Four times a day (QID) | ORAL | Status: DC | PRN

## 2021-09-10 MED ORDER — OXYTOCIN-SODIUM CHLORIDE 30-0.9 UT/500ML-% IV SOLN: 2.5000 [IU]/h | INTRAVENOUS | Status: DC | PRN

## 2021-09-10 MED ORDER — IBUPROFEN 600 MG PO TABS
ORAL_TABLET | ORAL | Status: AC
  Administered 2021-09-10: 600 mg via ORAL
  Filled 2021-09-10: qty 1

## 2021-09-10 MED ORDER — IBUPROFEN 600 MG PO TABS
600.0000 mg | ORAL_TABLET | Freq: Four times a day (QID) | ORAL | Status: DC
  Administered 2021-09-10 – 2021-09-12 (×6): 600 mg via ORAL
  Filled 2021-09-10 (×6): qty 1

## 2021-09-10 MED ORDER — DOCUSATE SODIUM 100 MG PO CAPS
100.0000 mg | ORAL_CAPSULE | Freq: Two times a day (BID) | ORAL | Status: DC
  Administered 2021-09-10 – 2021-09-12 (×4): 100 mg via ORAL
  Filled 2021-09-10 (×4): qty 1

## 2021-09-10 MED ORDER — PRENATAL MULTIVITAMIN CH
1.0000 | ORAL_TABLET | Freq: Every day | ORAL | Status: DC
  Administered 2021-09-11: 1 via ORAL
  Filled 2021-09-10: qty 1

## 2021-09-10 MED ORDER — METHYLERGONOVINE MALEATE 0.2 MG/ML IJ SOLN
0.2000 mg | INTRAMUSCULAR | Status: DC | PRN
  Filled 2021-09-10: qty 1

## 2021-09-10 MED ORDER — METHYLERGONOVINE MALEATE 0.2 MG PO TABS: 0.2000 mg | ORAL_TABLET | ORAL | Status: DC | PRN

## 2021-09-10 MED ORDER — ACETAMINOPHEN 325 MG PO TABS
650.0000 mg | ORAL_TABLET | ORAL | Status: DC | PRN
Start: 2021-09-10 — End: 2021-09-12
  Administered 2021-09-10 – 2021-09-11 (×5): 650 mg via ORAL
  Filled 2021-09-10 (×4): qty 2

## 2021-09-10 MED ORDER — BENZOCAINE-MENTHOL 20-0.5 % EX AERO
1.0000 "application " | INHALATION_SPRAY | CUTANEOUS | Status: DC | PRN
  Administered 2021-09-10: 1 via TOPICAL
  Filled 2021-09-10: qty 56

## 2021-09-10 NOTE — Progress Notes (Signed)
LABOR NOTE  ? ?SUBJECTIVE:  ? ?Amanda Barnett is a 26 y.o.GP@ at [redacted]w[redacted]d who is here for induction of labor. She has had minimal cervical change. She had a pitocin break for approximately an hour and now her Pitocin is being titrated up again. She has been changing positions frequently. ? ? ? ?Analgesia: Epidural ? ?OBJECTIVE:  ?BP (!) 105/44   Pulse 77   Temp 98.1 ?F (36.7 ?C) (Oral)   Resp 14   Ht 5\' 1"  (1.549 m)   Wt 90.3 kg   LMP 11/28/2020   SpO2 95%   BMI 37.60 kg/m?  ?Total I/O ?In: -  ?Out: 2850 [Urine:2850] ? ?SVE:   Dilation: 4 ?Effacement (%): 70 ?Station: Ballotable ?Exam by:: [email or web address] Guptill ?CONTRACTIONS: regular, every 2-3 minutes ?FHR: Fetal heart tracing reviewed. ?Baseline: 135 ?Variability: moderate ?Accelerations: present - 15x15 ?Decelerations:none ?Category 1 ? ?Labs: ?Lab Results  ?Component Value Date  ? WBC 5.8 09/09/2021  ? HGB 11.5 (L) 09/09/2021  ? HCT 34.5 (L) 09/09/2021  ? MCV 87.1 09/09/2021  ? PLT 260 09/09/2021  ? ? ?ASSESSMENT: ?1)  Induction of labor, minimal progress ?    Coping: well ?    Membranes: intact ?    ? ?Principal Problem: ?  Labor and delivery, indication for care ?Active Problems: ?  Opioid use disorder ?  Pregnancy complicated by subutex maintenance, antepartum (HCC) ? ? ?PLAN: ?Consulted with Dr. 09/11/2021 ?Will continue to titrate Pitocin  ?Attempt AROM once contractions are stronger and consider placing internal monitors ?Anticipate NSVD ? ?Logan Bores, CNM ?09/10/2021 ?5:41 AM ? ?  ?

## 2021-09-10 NOTE — Progress Notes (Signed)
LABOR NOTE  ? ?SUBJECTIVE:  ? ?Amanda Barnett is a 26 y.o.GP@ at [redacted]w[redacted]d here for induction of labor for postdates.  ? ?Analgesia: Epidural ? ?OBJECTIVE:  ?BP (!) 108/58   Pulse 81   Temp 97.8 ?F (36.6 ?C) (Oral)   Resp 16   Ht 5\' 1"  (1.549 m)   Wt 90.3 kg   LMP 11/28/2020   SpO2 98%   BMI 37.60 kg/m?  ?No intake/output data recorded. ? ?SVE:   Dilation: 4 ?Effacement (%): 70 ?Station: -3, Ballotable ?Exam by:: [email or web address] CNM ? ?CONTRACTIONS: regular, every 2-3 minutes ?FHR: Fetal heart tracing reviewed. ?Baseline: 130 ?Variability: moderate ?Accelerations: present, 15x15 ?Decelerations:one 3-minute decel to 90s that resolved with position change ?Category 2 ? ?Labs: ?Lab Results  ?Component Value Date  ? WBC 5.8 09/09/2021  ? HGB 11.5 (L) 09/09/2021  ? HCT 34.5 (L) 09/09/2021  ? MCV 87.1 09/09/2021  ? PLT 260 09/09/2021  ? ? ?ASSESSMENT: ?1) Induction of labor, minimal cervical change. Cervix more anterior than previous exam ?    Coping: well ?    Membranes:  intact ?     ?    ?Principal Problem: ?  Labor and delivery, indication for care ?Active Problems: ?  Opioid use disorder ?  Pregnancy complicated by subutex maintenance, antepartum (HCC) ? ? ?PLAN: ?Continue present management ?AROM when head well applied to cervix ?Position changes to encourage descent ?Anticipate NSVD ? ?09/11/2021, CNM ?09/10/2021 ?1:15 AM ? ?  ?

## 2021-09-10 NOTE — Lactation Note (Signed)
This note was copied from a baby's chart. ?Lactation Consultation Note ? ?Patient Name: Amanda Barnett ?Today's Date: 09/10/2021 ?Reason for consult: L&D Initial assessment ?Age:28 hours ?Lactation to L&D room for initial visit. Mother is holding the baby in football on the right, attempting to latch. LC encouraged turning the baby more toward her, tummy to mummy. Taught hand expression, Mother can easily express, drops placed into baby's mouth. He was disinterested in latching.  Encouraged feeding on demand and with cues. If baby is not cueing encouraged hand expression and skin to skin. LC and Mother expressed and fed to baby. Baby tolerated spoon feed well. Baby was left skin to skin with Mother. Encouraged 8 or more attempts in the first 24 hours and 8 or more good feeds after 24 HOL. Reviewed hand expression in INJOY booklet.  Mother has no further questions at this time.  ? ?Maternal Data ?Has patient been taught Hand Expression?: Yes ?Does the patient have breastfeeding experience prior to this delivery?: Yes ? ?Feeding ?Mother's Current Feeding Choice: Breast Milk ? ?LATCH Score ?Latch: Too sleepy or reluctant, no latch achieved, no sucking elicited. ? ?Audible Swallowing: None ? ?Type of Nipple: Everted at rest and after stimulation ? ?Comfort (Breast/Nipple): Soft / non-tender ? ?Hold (Positioning): Assistance needed to correctly position infant at breast and maintain latch. ? ?LATCH Score: 5 ? ?Interventions ?Interventions: Breast feeding basics reviewed;Assisted with latch;Hand express;Education ?  ?Consult Status ?Consult Status: Follow-up ? ?Amanda Barnett ?09/10/2021, 4:07 PM ? ? ? ?

## 2021-09-10 NOTE — Progress Notes (Signed)
LABOR NOTE  ? ?SUBJECTIVE:  ? ?Amanda Barnett is a 26 y.o.GP@ at [redacted]w[redacted]d here for induction of labor. Difficulty tracing contractions. Since AROM, fetal head is better applied to cervix and Amanda Barnett is now making cervical change. ? ?Analgesia: Epidural ? ?OBJECTIVE:  ?BP 105/61   Pulse 68   Temp 98.1 ?F (36.7 ?C) (Oral)   Resp 16   Ht 5\' 1"  (1.549 m)   Wt 90.3 kg   LMP 11/28/2020   SpO2 97%   BMI 37.60 kg/m?  ?Total I/O ?In: -  ?Out: 600 [Urine:600] ? ?SVE:   Dilation: 5 ?Effacement (%): 100 ?Station: -3 ?Exam by:: [email or web address] CNM ?CONTRACTIONS: regular, every 2-3 minutes ?FHR: Fetal heart tracing reviewed. ?Baseline: 120 ?Variability: moderate ?Accelerations: present - 15x15 ?Decelerations:none ?Category 1 ? ?Labs: ?Lab Results  ?Component Value Date  ? WBC 5.8 09/09/2021  ? HGB 11.5 (L) 09/09/2021  ? HCT 34.5 (L) 09/09/2021  ? MCV 87.1 09/09/2021  ? PLT 260 09/09/2021  ? ? ?ASSESSMENT: ?1) Induction of labor due to postdates,  progressing well on pitocin ?    Coping: in good spirits ?    Membranes: ruptured, clear fluid ?    Making cervical change ? ? ?Principal Problem: ?  Labor and delivery, indication for care ?Active Problems: ?  Opioid use disorder ?  Pregnancy complicated by subutex maintenance, antepartum (HCC) ? ? ?PLAN: ?Continue present management ?Anticipate NSVD ? ? ?09/11/2021, CNM ?09/10/2021 ?10:22 AM ? ?  ?

## 2021-09-10 NOTE — Progress Notes (Signed)
LABOR NOTE  ? ?SUBJECTIVE:  ? ?Amanda Barnett is a 26 y.o.GP@ at [redacted]w[redacted]d here for induction of labor. She has not had a consistent contraction pattern over the past few hours. Vertex confirmed with ultrasound. Dr. Logan Bores assessed the patient and performed AROM for clear fluid. ? ?Analgesia: Epidural ? ?OBJECTIVE:  ?BP 105/61   Pulse 68   Temp 98.1 ?F (36.7 ?C) (Oral)   Resp 16   Ht 5\' 1"  (1.549 m)   Wt 90.3 kg   LMP 11/28/2020   SpO2 97%   BMI 37.60 kg/m?  ?Total I/O ?In: -  ?Out: 600 [Urine:600] ? ?SVE:   Dilation: 4 ?Effacement (%): 70 ?Station: Ballotable ?Exam by:: Evans MD ?CONTRACTIONS: regular, every 3 minutes ?FHR: Fetal heart tracing reviewed. ?Baseline: 120 ?Variability: moderate ?Accelerations: present - 15x15 ?Decelerations:none ?Category 1 ? ?Labs: ?Lab Results  ?Component Value Date  ? WBC 5.8 09/09/2021  ? HGB 11.5 (L) 09/09/2021  ? HCT 34.5 (L) 09/09/2021  ? MCV 87.1 09/09/2021  ? PLT 260 09/09/2021  ? ? ?ASSESSMENT: ?1)  Induction of labor ?    Coping: well ?    Membranes: ruptured, clear fluid ?     ? ?Principal Problem: ?  Labor and delivery, indication for care ?Active Problems: ?  Opioid use disorder ?  Pregnancy complicated by subutex maintenance, antepartum (HCC) ? ? ?PLAN: ?Continue Pitocin ?Consider stopping Pitocin and using Cytotec if contractions do not strengthen ?Discussed plan with patient, including possible c/s ?Anticipate NSVD ?Plan reviewed with Dr. 09/11/2021 ? ?Logan Bores, CNM ?09/10/2021 ?9:03 AM ? ?  ?

## 2021-09-11 LAB — CBC
HCT: 32.1 % — ABNORMAL LOW (ref 36.0–46.0)
Hemoglobin: 10.6 g/dL — ABNORMAL LOW (ref 12.0–15.0)
MCH: 29.6 pg (ref 26.0–34.0)
MCHC: 33 g/dL (ref 30.0–36.0)
MCV: 89.7 fL (ref 80.0–100.0)
Platelets: 216 10*3/uL (ref 150–400)
RBC: 3.58 MIL/uL — ABNORMAL LOW (ref 3.87–5.11)
RDW: 13.4 % (ref 11.5–15.5)
WBC: 7.7 10*3/uL (ref 4.0–10.5)
nRBC: 0 % (ref 0.0–0.2)

## 2021-09-11 NOTE — TOC Progression Note (Signed)
Transition of Care (TOC) - Progression Note  ? ? ?Patient Details  ?Name: Amanda Barnett ?MRN: [id number] ?Date of Birth: Feb 15, 1996 ? ?Transition of Care (TOC) CM/SW Contact  ?Fynn Adel L Elora Wolter, LCSWA ?Phone Number: ?09/11/2021, 10:22 AM ? ?Clinical Narrative:    ? ?CSW spoke to patient at bedside. Patient reported having to do and extended hospital stay due to being on suboxone during pregnancy. Patient reported feeling mentally stable and having medication for mental health. Patient reported having strong familial support living with her husband and their two other children. Patient reported having 2 other boys at home and confirmed being familiar with PPD symptoms. Patient has essential baby items, PCP, and pediatrician for newborn. ? ?No TOC needs at this time.  ? ?  ?  ? ?Expected Discharge Plan and Services ?  ?  ?  ?  ?  ?                ?  ?  ?  ?  ?  ?  ?  ?  ?  ?  ? ? ?Social Determinants of Health (SDOH) Interventions ?  ? ?Readmission Risk Interventions ?   ? View : No data to display.  ?  ?  ?  ? ? ?

## 2021-09-11 NOTE — Lactation Note (Signed)
This note was copied from a baby's chart. ?Lactation Consultation Note ? ?Patient Name: Amanda Barnett ?Today's Date: 09/11/2021 ?Reason for consult: Follow-up assessment;Other (Comment) (ESC) ?Age:28 hours ?Lactation Rounds: ?LC to the room for a visit. Mother is attempting to latch when LC arrived. Mother unwrapped baby and positioned him in football on the left.  Mother states feeds are going well but he sometimes struggled to latch. Encouraged to hand express drops and stroke nipple nose to chin.  Baby has pursed lips, LC gave slight pressure to chin and he opened his mouth and was able to latch deep. He initially had clicking noted with swallows but had many swallows noted. Mother has a symphony pump at bedside. Shown how to use manual pump piece as well as reviewed how to clean pump parts.  LC reviewed and encouraged feeding on demand and with cues. If baby is not cueing we encourage hand expression and spoon feed to wake baby. Reviewed diaper counts for days of life and when to call Peds with questions. Reviewed "understanding Postpartum and Newborn care " booklet at bedside. Reviewed outpatient Lactation number and resources.  Parents stated understanding with all teaching.  ? ?Maternal Data ?Has patient been taught Hand Expression?: Yes ? ?Feeding ?Mother's Current Feeding Choice: Breast Milk ? ?LATCH Score ?Latch: Repeated attempts needed to sustain latch, nipple held in mouth throughout feeding, stimulation needed to elicit sucking reflex. ? ?Audible Swallowing: Spontaneous and intermittent ? ?Type of Nipple: Everted at rest and after stimulation ? ?Comfort (Breast/Nipple): Soft / non-tender ? ?Hold (Positioning): Assistance needed to correctly position infant at breast and maintain latch. ? ?LATCH Score: 8 ?  ?Interventions ?Interventions: Breast feeding basics reviewed;Assisted with latch;Hand express;Support pillows;Position options;Education ? ?Discharge ?Pump: DEBP (Symphony set up at  bedside) ? ?Consult Status ?Consult Status: Follow-up ? ? ? ?Brandye Inthavong D Calyn Rubi ?09/11/2021, 12:11 PM ? ? ? ?

## 2021-09-11 NOTE — Progress Notes (Signed)
Post Partum Day 1 ?Subjective: ?[name] feels well today; no complaints, up ad lib, voiding, tolerating PO, and + flatus. She feels ready to go home today, but since [name] needs to stay 5 days for NAS protocol, she decided to defer discharge until tomorrow. She and her partner are working on finding childcare and school transport for her other two children. ? ?Objective: ?Blood pressure 128/82, pulse 81, temperature 98.4 ?F (36.9 ?C), temperature source Oral, resp. rate 18, height 5\' 1"  (1.549 m), weight 90.3 kg, last menstrual period 11/28/2020, SpO2 99 %, unknown if currently breastfeeding. ? ?Physical Exam:  ?General: alert, cooperative, and appears stated age ?Lochia: appropriate ?Uterine Fundus: firm ?Incision: N/A ?DVT Evaluation: No evidence of DVT seen on physical exam. ? ?Recent Labs  ?  09/09/21 ?09/11/21 09/11/21 ?09/13/21  ?HGB 11.5* 10.6*  ?HCT 34.5* 32.1*  ? ? ?Assessment/Plan: ?Plan for discharge tomorrow, Breastfeeding, Social Work consult, and Contraception BTL at 6 weeks. ? ?Discharge teaching completed. ? ? LOS: 2 days  ? ?[id number] ?09/11/2021, 2:20 PM  ? ? ?

## 2021-09-12 MED ORDER — IBUPROFEN 600 MG PO TABS: 600.0000 mg | ORAL_TABLET | Freq: Four times a day (QID) | ORAL | 0 refills | Status: DC

## 2021-09-12 NOTE — Progress Notes (Signed)
Patient discharged, will be rooming in with infant through Thursday for ESC. Discharge instructions, when to follow up, and medications reviewed with patient. Patient verbalized understanding.    ?

## 2021-09-12 NOTE — Discharge Summary (Signed)
? ?   ?Patient Name: NGUYEN BUTLER ?DOB: 1995-10-20 ?MRN: [id number] ? ?                          Discharge Summary ? ?Date of Admission: 09/09/2021 ?Date of Discharge: 09/12/2021 ?Delivering Provider: Guadlupe Spanish M  ? ?Admitting Diagnosis: Labor and delivery, indication for care [O75.9] at [redacted]w[redacted]d ?Secondary diagnosis:  Principal Problem: ?  Labor and delivery, indication for care ?Active Problems: ?  Opioid use disorder ?  Pregnancy complicated by subutex maintenance, antepartum (HCC) ? ?Mode of Delivery: normal spontaneous vaginal delivery          ?    ?Discharge diagnosis: Term Pregnancy Delivered    ?  ?Intrapartum Procedures: epidural and small labial abrasion, no repair  ?  ?Post partum procedures:  none ? ?Complications: none ? ?                   Discharge Day SOAP Note: ? ?Progress Note - Vaginal Delivery ? ?RAJVI ARMENTOR is a 26 y.o. [phone] now PP day 2 s/p Vaginal, Spontaneous . Delivery was complicated by opioid disorder ? ?Subjective ? ?The patient has the following complaints: has no unusual complaints ? Pain is controlled with current medications.   Patient is urinating without difficulty.  She is ambulating well.   ? ? ?Objective ? ?Vital signs: ?BP 118/71 (BP Location: Right Arm)   Pulse 86   Temp 98 ?F (36.7 ?C) (Oral)   Resp 18   Ht 5\' 1"  (1.549 m)   Wt 90.3 kg   LMP 11/28/2020   SpO2 100%   Breastfeeding Unknown   BMI 37.60 kg/m?  ? ?Physical Exam: ?Gen: NAD ?Fundus Fundal Tone: Firm  ?Lochia Amount: Small  ?   ? ?  ?Data Review ?Labs: ?Lab Results  ?Component Value Date  ? WBC 7.7 09/11/2021  ? HGB 10.6 (L) 09/11/2021  ? HCT 32.1 (L) 09/11/2021  ? MCV 89.7 09/11/2021  ? PLT 216 09/11/2021  ? ? ?  Latest Ref Rng & Units 09/11/2021  ?  6:13 AM 09/09/2021  ?  9:33 AM 06/13/2021  ? 10:11 AM  ?CBC  ?WBC 4.0 - 10.5 K/uL 7.7   5.8   6.3    ?Hemoglobin 12.0 - 15.0 g/dL 06/15/2021   69.7   94.8    ?Hematocrit 36.0 - 46.0 % 32.1   34.5   38.3    ?Platelets 150 - 400 K/uL 216   260   260     ? ?AB POS ? ?Edinburgh Score: ? ?  09/10/2021  ?  9:33 PM  ?09/12/2021 Postnatal Depression Scale Screening Tool  ?I have been able to laugh and see the funny side of things. 0  ?I have looked forward with enjoyment to things. 0  ?I have blamed myself unnecessarily when things went wrong. 0  ?I have been anxious or worried for no good reason. 0  ?I have felt scared or panicky for no good reason. 0  ?Things have been getting on top of me. 1  ?I have been so unhappy that I have had difficulty sleeping. 0  ?I have felt sad or miserable. 0  ?I have been so unhappy that I have been crying. 0  ?The thought of harming myself has occurred to me. 0  ?Edinburgh Postnatal Depression Scale Total 1  ? ? ?Assessment/Plan ? ?Principal Problem: ?  Labor and delivery, indication for care ?Active  Problems: ?  Opioid use disorder ?  Pregnancy complicated by subutex maintenance, antepartum (HCC) ?  ? ?Plan for discharge today. ? ?Discharge Instructions: Per After Visit Summary. ?Activity: Advance as tolerated. Pelvic rest for 6 weeks.  Also refer to After Visit Summary ?Diet: Regular ?Medications: ?Allergies as of 09/12/2021   ? ?   Reactions  ? Latex Itching  ? ?  ? ?  ?Medication List  ?  ? ?TAKE these medications   ? ?buprenorphine-naloxone 2-0.5 mg Subl SL tablet ?Commonly known as: SUBOXONE ?Place 1 tablet under the tongue daily. ?  ?FLUoxetine 20 MG capsule ?Commonly known as: PROZAC ?Take 60 mg by mouth every morning. ?  ?ibuprofen 600 MG tablet ?Commonly known as: ADVIL ?Take 1 tablet (600 mg total) by mouth every 6 (six) hours. ?  ?PRENATAL 1 PO ?Take by mouth. ?  ? ?  ? ?Outpatient follow up: 2 week tele visit, 6 wk ppv with Melissa  ?Postpartum contraception: Plans outpatient BTL with Dr. Logan Bores ? ?Discharged Condition: good ? ?Discharged to: home ? ?Newborn Data: ?Disposition:[name] needs to stay 5 days for NAS protocol ? ?Apgars: APGAR (1 MIN): 6   ?APGAR (5 MINS): 9   ?APGAR (10 MINS):   ? ?Baby Feeding: Breast ? ?  ?Doreene Burke, CNM ?09/12/2021 ?8:14 AM   ? ? ?

## 2021-09-12 NOTE — Anesthesia Postprocedure Evaluation (Signed)
Anesthesia Post Note ? ?Patient: Amanda Barnett ? ?Procedure(s) Performed: AN AD HOC LABOR EPIDURAL ? ?Patient location during evaluation: Mother Baby ?Anesthesia Type: Epidural ?Level of consciousness: awake and alert ?Pain management: pain level controlled ?Vital Signs Assessment: post-procedure vital signs reviewed and stable ?Respiratory status: spontaneous breathing, nonlabored ventilation and respiratory function stable ?Cardiovascular status: stable ?Postop Assessment: no headache, no backache and epidural receding ?Anesthetic complications: no ? ? ?No notable events documented. ? ? ?Last Vitals:  ?Vitals:  ? 09/12/21 0900 09/12/21 0932  ?BP: (!) 136/94 140/79  ?Pulse: 75   ?Resp: 17   ?Temp: 36.9 ?C   ?SpO2: 100%   ?  ?Last Pain:  ?Vitals:  ? 09/12/21 0908  ?TempSrc:   ?PainSc: 0-No pain  ? ? ?  ?  ?  ?  ?  ?  ? ?Yosgart Pavey B Matthias Bogus ? ? ? ? ?

## 2021-09-12 NOTE — Discharge Instructions (Signed)

## 2021-09-12 NOTE — Final Progress Note (Signed)
?                   Discharge Day SOAP Note: ? ?Progress Note - Vaginal Delivery ? ?Amanda Barnett is a 26 y.o. [phone] now PP day 2 s/p Vaginal, Spontaneous . Delivery was complicated by opioid disorder ? ?Subjective ? ?The patient has the following complaints: has no unusual complaints ? Pain is controlled with current medications.   Patient is urinating without difficulty.  She is ambulating well.   ? ? ?Objective ? ?Vital signs: ?BP 118/71 (BP Location: Right Arm)   Pulse 86   Temp 98 ?F (36.7 ?C) (Oral)   Resp 18   Ht 5\' 1"  (1.549 m)   Wt 90.3 kg   LMP 11/28/2020   SpO2 100%   Breastfeeding Unknown   BMI 37.60 kg/m?  ? ?Physical Exam: ?Gen: NAD ?Fundus Fundal Tone: Firm  ?Lochia Amount: Small  ?   ? ?  ?Data Review ?Labs: ?Lab Results  ?Component Value Date  ? WBC 7.7 09/11/2021  ? HGB 10.6 (L) 09/11/2021  ? HCT 32.1 (L) 09/11/2021  ? MCV 89.7 09/11/2021  ? PLT 216 09/11/2021  ? ? ?  Latest Ref Rng & Units 09/11/2021  ?  6:13 AM 09/09/2021  ?  9:33 AM 06/13/2021  ? 10:11 AM  ?CBC  ?WBC 4.0 - 10.5 K/uL 7.7   5.8   6.3    ?Hemoglobin 12.0 - 15.0 g/dL 10.6   11.5   12.8    ?Hematocrit 36.0 - 46.0 % 32.1   34.5   38.3    ?Platelets 150 - 400 K/uL 216   260   260    ? ?AB POS ? ?Edinburgh Score: ? ?  09/10/2021  ?  9:33 PM  ?Flavia Shipper Postnatal Depression Scale Screening Tool  ?I have been able to laugh and see the funny side of things. 0  ?I have looked forward with enjoyment to things. 0  ?I have blamed myself unnecessarily when things went wrong. 0  ?I have been anxious or worried for no good reason. 0  ?I have felt scared or panicky for no good reason. 0  ?Things have been getting on top of me. 1  ?I have been so unhappy that I have had difficulty sleeping. 0  ?I have felt sad or miserable. 0  ?I have been so unhappy that I have been crying. 0  ?The thought of harming myself has occurred to me. 0  ?Edinburgh Postnatal Depression Scale Total 1  ? ? ?Assessment/Plan ? ?Principal Problem: ?  Labor and  delivery, indication for care ?Active Problems: ?  Opioid use disorder ?  Pregnancy complicated by subutex maintenance, antepartum ([place]) ?  ? ?Plan for discharge today. ? ?Discharge Instructions: Per After Visit Summary. ?Activity: Advance as tolerated. Pelvic rest for 6 weeks.  Also refer to After Visit Summary ?Diet: Regular ?Medications: ?Allergies as of 09/12/2021   ? ?   Reactions  ? Latex Itching  ? ?  ? ?  ?Medication List  ?  ? ?TAKE these medications   ? ?buprenorphine-naloxone 2-0.5 mg Subl SL tablet ?Commonly known as: SUBOXONE ?Place 1 tablet under the tongue daily. ?  ?FLUoxetine 20 MG capsule ?Commonly known as: PROZAC ?Take 60 mg by mouth every morning. ?  ?ibuprofen 600 MG tablet ?Commonly known as: ADVIL ?Take 1 tablet (600 mg total) by mouth every 6 (six) hours. ?  ?PRENATAL 1 PO ?Take by mouth. ?  ? ?  ? ?  Outpatient follow up: 2 week tele visit, 6 wk ppv with Melissa  ?Postpartum contraception: Plans outpatient BTL with Dr. Amalia Hailey ? ?Discharged Condition: good ? ?Discharged to: home ? ?Newborn Data: ?Disposition:[name] needs to stay 5 days for NAS protocol ? ?Apgars: APGAR (1 MIN): 6   ?APGAR (5 MINS): 9   ?APGAR (10 MINS):   ? ?Baby Feeding: Breast ? ?  ?Philip Aspen, CNM ?09/12/2021 ?8:14 AM   ?

## 2021-09-13 ENCOUNTER — Ambulatory Visit: Payer: Self-pay

## 2021-09-13 NOTE — Lactation Note (Signed)
This note was copied from a baby's chart. ?Lactation Consultation Note ? ?Patient Name: Amanda Barnett ?Today's Date: 09/13/2021 ?Reason for consult: Follow-up assessment ?Age:28 hours ? ?Lactation follow-up. ? ?Maternal Data ?Has patient been taught Hand Expression?: Yes ?Does the patient have breastfeeding experience prior to this delivery?: Yes ?How long did the patient breastfeed?: short period ? ?Feeding ?Mother's Current Feeding Choice: Breast Milk ? ?Mom just recently fed Amanda Barnett and has completed a pumping session receiving 22mL of what appears to be transitional milk. ?Mom notes heavier/uncomfortable breasts, but feels a difference before/after feeding and before/after pumping. ?-LC placed EBM in fridge with appropriate label. ? ?LATCH Score ?  ?Baby had fed <2hrs prior to visit and was asleep. ? ? ?Lactation Tools Discussed/Used ?Tools: Pump ?Breast pump type: Double-Electric Breast Pump ?Pump Education:  (previously set up by RN) ?Reason for Pumping: Mom's choice ?Pumping frequency: after breastfeeds ?Pumped volume: 30 mL ? ?Mom currently using hospital DEBP, and doing well, plans to use hands free bra once support person brings later. ? ?Interventions ?Interventions: Breast feeding basics reviewed;DEBP;Pace feeding (feeding plan w/ potential for need of EBM as supplement) ? ?We discussed possible need for supplementation due to high weight loss (9%) in baby. RN is monitoring weight, re-weighing again around 12pm/1pm. We talked about using EBM has supplement post feedings if needed, mom on board. ? ?Discharge ?  ?ESC- 5 days ? ?Consult Status ?Consult Status: PRN ? ? ? ?Amanda Barnett ?09/13/2021, 10:26 AM ? ? ? ?

## 2021-09-22 ENCOUNTER — Telehealth (INDEPENDENT_AMBULATORY_CARE_PROVIDER_SITE_OTHER): Payer: Medicaid Other | Admitting: Obstetrics

## 2021-09-22 NOTE — Progress Notes (Signed)
Virtual Visit via Video Note ? ?I connected with Amanda Barnett on 09/22/21 at  3:45 PM EDT by a video enabled telemedicine application and verified that I am speaking with the correct person using two identifiers. ? ?Location: ?Patient: home ?Provider: office ?  ?I discussed the limitations of evaluation and management by telemedicine and the availability of in person appointments. The patient expressed understanding and agreed to proceed. ? ?History of Present Illness: ?Amanda Barnett is a 26 y.o. [phone] who is s/p NSVD on 09/10/21 at [redacted]w[redacted]d . She received epidural analgesia and gave birth to a viable female infant weighing 6 lbs., 9 oz. Her perineum was intact. She is breastfeeding. Amanda Barnett and her infant had a 5-day stay in the hospital for NAS protocol and are now at home. ?  ?Observations/Objective: ?1) Breasts: BF going well. Denies pain in breasts. Good milk supply. ?2) Bleeding: Minimal ?3) Pain: Denies ?4) Bowel/bladder: normal function ?5) Mood: stable. Feels well. EPDS 0 ?6) Infant: doing well, gaining weight ?7) Appetite: Good. Getting plenty to eat. ? ?Assessment and Plan: ?Encouraged rest when possible. Amanda Barnett is active and busy caring for all three children. Reviewed normal PP course and planned follow up.  ? ?Follow Up Instructions: ?BTL scheduled for 10/10/21. ?6 week PP visit 10/21/21 ?Call with questions or concerns. ?  ?I discussed the assessment and treatment plan with the patient. The patient was provided an opportunity to ask questions and all were answered. The patient agreed with the plan and demonstrated an understanding of the instructions. ?  ?The patient was advised to call back or seek an in-person evaluation if the symptoms worsen or if the condition fails to improve as anticipated. ? ?I provided 6 minutes of non-face-to-face time during this encounter. ? ? ?Amanda Barnett, CNM ? ? ?

## 2021-09-29 ENCOUNTER — Encounter: Payer: Self-pay | Admitting: Obstetrics and Gynecology

## 2021-09-29 ENCOUNTER — Ambulatory Visit (INDEPENDENT_AMBULATORY_CARE_PROVIDER_SITE_OTHER): Payer: Medicaid Other | Admitting: Obstetrics and Gynecology

## 2021-09-29 VITALS — BP 133/88 | HR 101 | Ht 61.0 in | Wt 179.8 lb

## 2021-09-29 DIAGNOSIS — Z01818 Encounter for other preprocedural examination: Secondary | ICD-10-CM

## 2021-09-29 NOTE — H&P (Signed)
? ?    PRE-OPERATIVE HISTORY AND PHYSICAL EXAM ? ?PCP:  Valera Castle, MD ?Subjective:  ? ?HPI:  Amanda Barnett is a 26 y.o. [phone].  No LMP recorded.  She presents today for a pre-op discussion and PE. ? ?She has the following symptoms: Desires permanent sterilization ? ?Review of Systems:  ? ?Constitutional: Denied constitutional symptoms, night sweats, recent illness, fatigue, fever, insomnia and weight loss.  ?Eyes: Denied eye symptoms, eye pain, photophobia, vision change and visual disturbance.  ?Ears/Nose/Throat/Neck: Denied ear, nose, throat or neck symptoms, hearing loss, nasal discharge, sinus congestion and sore throat.  ?Cardiovascular: Denied cardiovascular symptoms, arrhythmia, chest pain/pressure, edema, exercise intolerance, orthopnea and palpitations.  ?Respiratory: Denied pulmonary symptoms, asthma, pleuritic pain, productive sputum, cough, dyspnea and wheezing.  ?Gastrointestinal: Denied, gastro-esophageal reflux, melena, nausea and vomiting.  ?Genitourinary: Denied genitourinary symptoms including symptomatic vaginal discharge, pelvic relaxation issues, and urinary complaints.  ?Musculoskeletal: Denied musculoskeletal symptoms, stiffness, swelling, muscle weakness and myalgia.  ?Dermatologic: Denied dermatology symptoms, rash and scar.  ?Neurologic: Denied neurology symptoms, dizziness, headache, neck pain and syncope.  ?Psychiatric: Denied psychiatric symptoms, anxiety and depression.  ?Endocrine: Denied endocrine symptoms including hot flashes and night sweats.  ? ?OB History  ?Gravida Para Term Preterm AB Living  ?3 3 2 1   3   ?SAB IAB Ectopic Multiple Live Births  ?      0 3  ?  ?# Outcome Date GA Lbr Len/2nd Weight Sex Delivery Anes PTL Lv  ?3 Term 09/10/21 [redacted]w[redacted]d / 00:23 6 lb 9.5 oz (2.99 kg) M Vag-Spont EPI  LIV  ?2 Preterm 02/28/17 [redacted]w[redacted]d 05:21 / 00:45 5 lb 12.1 oz (2.61 kg) M Vag-Spont EPI  LIV  ?1 Term 08/30/13 [redacted]w[redacted]d  6 lb 11.2 oz (3.039 kg) M Vag-Spont  N LIV  ? ? ?Past  Medical History:  ?Diagnosis Date  ? Abdominal pain, recurrent   ? Amenorrhea   ? Asthma   ? as a child  ? GERD (gastroesophageal reflux disease)   ? Infantile colic   ? resolved  ? ? ?Past Surgical History:  ?Procedure Laterality Date  ? CHOLECYSTECTOMY    ? RHINOPLASTY N/A 01/07/2015  ? Procedure: RHINOPLASTY;  Surgeon: Margaretha Sheffield, MD;  Location: [facility];  Service: ENT;  Laterality: N/A;  ? RHINOPLASTY    ? SEPTOPLASTY    ? SEPTOPLASTY N/A 01/07/2015  ? Procedure: SEPTOPLASTY;  Surgeon: Margaretha Sheffield, MD;  Location: [facility];  Service: ENT;  Laterality: N/A;  GAVE DISK TO [facility] 8-10 KP  ?   ? ?SOCIAL HISTORY: ? ?Social History  ? ?Tobacco Use  ?Smoking Status Some Days  ? Packs/day: 0.25  ? Years: 2.00  ? Pack years: 0.50  ? Types: Cigarettes  ?Smokeless Tobacco Never  ? ?Social History  ? ?Substance and Sexual Activity  ?Alcohol Use Not Currently  ? ? ?Social History  ? ?Substance and Sexual Activity  ?Drug Use Not Currently  ? Types: Marijuana, Cocaine  ? Comment: METHADONE x4-5 MONTHS  ? ? ?Family History  ?Problem Relation Age of Onset  ? Pancreatitis Mother   ? Hypertension Mother   ? Migraines Mother   ? Obesity Father   ? Cholelithiasis Maternal Grandmother   ? Rheum arthritis Maternal Grandmother   ? Hypertension Maternal Grandmother   ? ? ?ALLERGIES:  Latex ? ?MEDS: ?  ?Current Outpatient Medications on File Prior to Visit  ?Medication Sig Dispense Refill  ? buprenorphine-naloxone (SUBOXONE) 2-0.5 mg SUBL SL tablet Place  1 tablet under the tongue daily.    ? FLUoxetine (PROZAC) 20 MG capsule Take 60 mg by mouth every morning.    ? ibuprofen (ADVIL) 600 MG tablet Take 1 tablet (600 mg total) by mouth every 6 (six) hours. 30 tablet 0  ? Prenatal MV-Min-Fe Fum-FA-DHA (PRENATAL 1 PO) Take by mouth.    ? ?No current facility-administered medications on file prior to visit.  ? ? ?No orders of the defined types were placed in this encounter. ?  ? ?Physical examination ?BP 133/88   Pulse (!)  101   Ht 5' 1" (1.549 m)   Wt 179 lb 12.8 oz (81.6 kg)   Breastfeeding Yes   BMI 33.97 kg/m?  ? ?General NAD, Conversant  ?HEENT Atraumatic; Op clear with mmm.  Normo-cephalic. Pupils reactive. Anicteric sclerae  ?Thyroid/Neck Smooth without nodularity or enlargement. Normal ROM.  Neck Supple.  ?Skin No rashes, lesions or ulceration. Normal palpated skin turgor. No nodularity.  ?Breasts: No masses or discharge.  Symmetric.  No axillary adenopathy.  ?Lungs: Clear to auscultation.No rales or wheezes. Normal Respiratory effort, no retractions.  ?Heart: NSR.  No murmurs or rubs appreciated. No periferal edema  ?Abdomen: Soft.  Non-tender.  No masses.  No HSM. No hernia  ?Extremities: Moves all appropriately.  Normal ROM for age. No lymphadenopathy.  ?Neuro: Oriented to PPT.  Normal mood. Normal affect.  ? ?  Pelvic:   ?Vulva: Normal appearance.  No lesions.  ?Vagina: No lesions or abnormalities noted.  ?Support: Normal pelvic support.  ?Urethra No masses tenderness or scarring.  ?Meatus Normal size without lesions or prolapse.  ?Cervix: Normal ectropion.  No lesions.  ?Anus: Normal exam.  No lesions.  ?Perineum: Normal exam.  No lesions.  ?      Bimanual   ?Uterus: Normal size.  Non-tender.  Mobile.  AV.  ?Adnexae: No masses.  Non-tender to palpation.  ?Cul-de-sac: Negative for abnormality.  ? ?Assessment:  ? ?[id number] ?Patient Active Problem List  ? Diagnosis Date Noted  ? Opioid use disorder 09/09/2021  ? Pregnancy complicated by subutex maintenance, antepartum ([facility]) 09/09/2021  ? Labor and delivery, indication for care 07/23/2021  ? Abnormal uterine bleeding 03/12/2019  ? Suboxone maintenance treatment complicating pregnancy, antepartum ([facility]) 09/13/2016  ? Anxiety 07/03/2014  ? Depression 07/03/2014  ? ? ?1. Pre-op exam   ? ? She has reaffirmed her desire for permanent sterilization.  Other forms of birth control including long-acting methods were discussed in detail the patient is not interested. ? ?Plan:   ? ?Orders: ?No orders of the defined types were placed in this encounter. ?  ? ?1.  Laparoscopic permanent sterilization using Filshie clips ? ?

## 2021-09-29 NOTE — H&P (View-Only) (Signed)
? ?    PRE-OPERATIVE HISTORY AND PHYSICAL EXAM ? ?PCP:  Valera Castle, MD ?Subjective:  ? ?HPI:  Amanda Barnett is a 26 y.o. [phone].  No LMP recorded.  She presents today for a pre-op discussion and PE. ? ?She has the following symptoms: Desires permanent sterilization ? ?Review of Systems:  ? ?Constitutional: Denied constitutional symptoms, night sweats, recent illness, fatigue, fever, insomnia and weight loss.  ?Eyes: Denied eye symptoms, eye pain, photophobia, vision change and visual disturbance.  ?Ears/Nose/Throat/Neck: Denied ear, nose, throat or neck symptoms, hearing loss, nasal discharge, sinus congestion and sore throat.  ?Cardiovascular: Denied cardiovascular symptoms, arrhythmia, chest pain/pressure, edema, exercise intolerance, orthopnea and palpitations.  ?Respiratory: Denied pulmonary symptoms, asthma, pleuritic pain, productive sputum, cough, dyspnea and wheezing.  ?Gastrointestinal: Denied, gastro-esophageal reflux, melena, nausea and vomiting.  ?Genitourinary: Denied genitourinary symptoms including symptomatic vaginal discharge, pelvic relaxation issues, and urinary complaints.  ?Musculoskeletal: Denied musculoskeletal symptoms, stiffness, swelling, muscle weakness and myalgia.  ?Dermatologic: Denied dermatology symptoms, rash and scar.  ?Neurologic: Denied neurology symptoms, dizziness, headache, neck pain and syncope.  ?Psychiatric: Denied psychiatric symptoms, anxiety and depression.  ?Endocrine: Denied endocrine symptoms including hot flashes and night sweats.  ? ?OB History  ?Gravida Para Term Preterm AB Living  ?3 3 2 1   3   ?SAB IAB Ectopic Multiple Live Births  ?      0 3  ?  ?# Outcome Date GA Lbr Len/2nd Weight Sex Delivery Anes PTL Lv  ?3 Term 09/10/21 [redacted]w[redacted]d / 00:23 6 lb 9.5 oz (2.99 kg) M Vag-Spont EPI  LIV  ?2 Preterm 02/28/17 [redacted]w[redacted]d 05:21 / 00:45 5 lb 12.1 oz (2.61 kg) M Vag-Spont EPI  LIV  ?1 Term 08/30/13 [redacted]w[redacted]d  6 lb 11.2 oz (3.039 kg) M Vag-Spont  N LIV  ? ? ?Past  Medical History:  ?Diagnosis Date  ? Abdominal pain, recurrent   ? Amenorrhea   ? Asthma   ? as a child  ? GERD (gastroesophageal reflux disease)   ? Infantile colic   ? resolved  ? ? ?Past Surgical History:  ?Procedure Laterality Date  ? CHOLECYSTECTOMY    ? RHINOPLASTY N/A 01/07/2015  ? Procedure: RHINOPLASTY;  Surgeon: Margaretha Sheffield, MD;  Location: [facility];  Service: ENT;  Laterality: N/A;  ? RHINOPLASTY    ? SEPTOPLASTY    ? SEPTOPLASTY N/A 01/07/2015  ? Procedure: SEPTOPLASTY;  Surgeon: Margaretha Sheffield, MD;  Location: [facility];  Service: ENT;  Laterality: N/A;  GAVE DISK TO [facility] 8-10 KP  ?   ? ?SOCIAL HISTORY: ? ?Social History  ? ?Tobacco Use  ?Smoking Status Some Days  ? Packs/day: 0.25  ? Years: 2.00  ? Pack years: 0.50  ? Types: Cigarettes  ?Smokeless Tobacco Never  ? ?Social History  ? ?Substance and Sexual Activity  ?Alcohol Use Not Currently  ? ? ?Social History  ? ?Substance and Sexual Activity  ?Drug Use Not Currently  ? Types: Marijuana, Cocaine  ? Comment: METHADONE x4-5 MONTHS  ? ? ?Family History  ?Problem Relation Age of Onset  ? Pancreatitis Mother   ? Hypertension Mother   ? Migraines Mother   ? Obesity Father   ? Cholelithiasis Maternal Grandmother   ? Rheum arthritis Maternal Grandmother   ? Hypertension Maternal Grandmother   ? ? ?ALLERGIES:  Latex ? ?MEDS: ?  ?Current Outpatient Medications on File Prior to Visit  ?Medication Sig Dispense Refill  ? buprenorphine-naloxone (SUBOXONE) 2-0.5 mg SUBL SL tablet Place  1 tablet under the tongue daily.    ? FLUoxetine (PROZAC) 20 MG capsule Take 60 mg by mouth every morning.    ? ibuprofen (ADVIL) 600 MG tablet Take 1 tablet (600 mg total) by mouth every 6 (six) hours. 30 tablet 0  ? Prenatal MV-Min-Fe Fum-FA-DHA (PRENATAL 1 PO) Take by mouth.    ? ?No current facility-administered medications on file prior to visit.  ? ? ?No orders of the defined types were placed in this encounter. ?  ? ?Physical examination ?BP 133/88   Pulse (!)  101   Ht 5\' 1"  (1.549 m)   Wt 179 lb 12.8 oz (81.6 kg)   Breastfeeding Yes   BMI 33.97 kg/m?  ? ?General NAD, Conversant  ?HEENT Atraumatic; Op clear with mmm.  Normo-cephalic. Pupils reactive. Anicteric sclerae  ?Thyroid/Neck Smooth without nodularity or enlargement. Normal ROM.  Neck Supple.  ?Skin No rashes, lesions or ulceration. Normal palpated skin turgor. No nodularity.  ?Breasts: No masses or discharge.  Symmetric.  No axillary adenopathy.  ?Lungs: Clear to auscultation.No rales or wheezes. Normal Respiratory effort, no retractions.  ?Heart: NSR.  No murmurs or rubs appreciated. No periferal edema  ?Abdomen: Soft.  Non-tender.  No masses.  No HSM. No hernia  ?Extremities: Moves all appropriately.  Normal ROM for age. No lymphadenopathy.  ?Neuro: Oriented to PPT.  Normal mood. Normal affect.  ? ?  Pelvic:   ?Vulva: Normal appearance.  No lesions.  ?Vagina: No lesions or abnormalities noted.  ?Support: Normal pelvic support.  ?Urethra No masses tenderness or scarring.  ?Meatus Normal size without lesions or prolapse.  ?Cervix: Normal ectropion.  No lesions.  ?Anus: Normal exam.  No lesions.  ?Perineum: Normal exam.  No lesions.  ?      Bimanual   ?Uterus: Normal size.  Non-tender.  Mobile.  AV.  ?Adnexae: No masses.  Non-tender to palpation.  ?Cul-de-sac: Negative for abnormality.  ? ?Assessment:  ? ? ?Patient Active Problem List  ? Diagnosis Date Noted  ? Opioid use disorder 09/09/2021  ? Pregnancy complicated by subutex maintenance, antepartum ([facility]CC) 09/09/2021  ? Labor and delivery, indication for care 07/23/2021  ? Abnormal uterine bleeding 03/12/2019  ? Suboxone maintenance treatment complicating pregnancy, antepartum ([facility]) 09/13/2016  ? Anxiety 07/03/2014  ? Depression 07/03/2014  ? ? ?1. Pre-op exam   ? ? She has reaffirmed her desire for permanent sterilization.  Other forms of birth control including long-acting methods were discussed in detail the patient is not interested. ? ?Plan:   ? ?Orders: ?No orders of the defined types were placed in this encounter. ?  ? ?1.  Laparoscopic permanent sterilization using Filshie clips ? ?

## 2021-09-29 NOTE — Progress Notes (Signed)
Patient presents today for pre-op exam for upcoming BTL.She recently had a vaginal delivery, states bleeding has stopped and she is breastfeeding. States she has no questions at this time.  ?

## 2021-09-29 NOTE — Progress Notes (Signed)
? ?    PRE-OPERATIVE HISTORY AND PHYSICAL EXAM ? ?PCP:  Valera Castle, MD ?Subjective:  ? ?HPI:  Amanda Barnett is a 26 y.o. [phone].  No LMP recorded.  She presents today for a pre-op discussion and PE. ? ?She has the following symptoms: Desires permanent sterilization ? ?Review of Systems:  ? ?Constitutional: Denied constitutional symptoms, night sweats, recent illness, fatigue, fever, insomnia and weight loss.  ?Eyes: Denied eye symptoms, eye pain, photophobia, vision change and visual disturbance.  ?Ears/Nose/Throat/Neck: Denied ear, nose, throat or neck symptoms, hearing loss, nasal discharge, sinus congestion and sore throat.  ?Cardiovascular: Denied cardiovascular symptoms, arrhythmia, chest pain/pressure, edema, exercise intolerance, orthopnea and palpitations.  ?Respiratory: Denied pulmonary symptoms, asthma, pleuritic pain, productive sputum, cough, dyspnea and wheezing.  ?Gastrointestinal: Denied, gastro-esophageal reflux, melena, nausea and vomiting.  ?Genitourinary: Denied genitourinary symptoms including symptomatic vaginal discharge, pelvic relaxation issues, and urinary complaints.  ?Musculoskeletal: Denied musculoskeletal symptoms, stiffness, swelling, muscle weakness and myalgia.  ?Dermatologic: Denied dermatology symptoms, rash and scar.  ?Neurologic: Denied neurology symptoms, dizziness, headache, neck pain and syncope.  ?Psychiatric: Denied psychiatric symptoms, anxiety and depression.  ?Endocrine: Denied endocrine symptoms including hot flashes and night sweats.  ? ?OB History  ?Gravida Para Term Preterm AB Living  ?3 3 2 1   3   ?SAB IAB Ectopic Multiple Live Births  ?      0 3  ?  ?# Outcome Date GA Lbr Len/2nd Weight Sex Delivery Anes PTL Lv  ?3 Term 09/10/21 [redacted]w[redacted]d / 00:23 6 lb 9.5 oz (2.99 kg) M Vag-Spont EPI  LIV  ?2 Preterm 02/28/17 [redacted]w[redacted]d 05:21 / 00:45 5 lb 12.1 oz (2.61 kg) M Vag-Spont EPI  LIV  ?1 Term 08/30/13 [redacted]w[redacted]d  6 lb 11.2 oz (3.039 kg) M Vag-Spont  N LIV  ? ? ?Past  Medical History:  ?Diagnosis Date  ? Abdominal pain, recurrent   ? Amenorrhea   ? Asthma   ? as a child  ? GERD (gastroesophageal reflux disease)   ? Infantile colic   ? resolved  ? ? ?Past Surgical History:  ?Procedure Laterality Date  ? CHOLECYSTECTOMY    ? RHINOPLASTY N/A 01/07/2015  ? Procedure: RHINOPLASTY;  Surgeon: Margaretha Sheffield, MD;  Location: [facility];  Service: ENT;  Laterality: N/A;  ? RHINOPLASTY    ? SEPTOPLASTY    ? SEPTOPLASTY N/A 01/07/2015  ? Procedure: SEPTOPLASTY;  Surgeon: Margaretha Sheffield, MD;  Location: [facility];  Service: ENT;  Laterality: N/A;  GAVE DISK TO [facility] 8-10 KP  ?   ? ?SOCIAL HISTORY: ? ?Social History  ? ?Tobacco Use  ?Smoking Status Some Days  ? Packs/day: 0.25  ? Years: 2.00  ? Pack years: 0.50  ? Types: Cigarettes  ?Smokeless Tobacco Never  ? ?Social History  ? ?Substance and Sexual Activity  ?Alcohol Use Not Currently  ? ? ?Social History  ? ?Substance and Sexual Activity  ?Drug Use Not Currently  ? Types: Marijuana, Cocaine  ? Comment: METHADONE x4-5 MONTHS  ? ? ?Family History  ?Problem Relation Age of Onset  ? Pancreatitis Mother   ? Hypertension Mother   ? Migraines Mother   ? Obesity Father   ? Cholelithiasis Maternal Grandmother   ? Rheum arthritis Maternal Grandmother   ? Hypertension Maternal Grandmother   ? ? ?ALLERGIES:  Latex ? ?MEDS: ?  ?Current Outpatient Medications on File Prior to Visit  ?Medication Sig Dispense Refill  ? buprenorphine-naloxone (SUBOXONE) 2-0.5 mg SUBL SL tablet Place  1 tablet under the tongue daily.    ? FLUoxetine (PROZAC) 20 MG capsule Take 60 mg by mouth every morning.    ? ibuprofen (ADVIL) 600 MG tablet Take 1 tablet (600 mg total) by mouth every 6 (six) hours. 30 tablet 0  ? Prenatal MV-Min-Fe Fum-FA-DHA (PRENATAL 1 PO) Take by mouth.    ? ?No current facility-administered medications on file prior to visit.  ? ? ?No orders of the defined types were placed in this encounter. ?  ? ?Physical examination ?BP 133/88   Pulse (!)  101   Ht 5\' 1"  (1.549 m)   Wt 179 lb 12.8 oz (81.6 kg)   Breastfeeding Yes   BMI 33.97 kg/m?  ? ?General NAD, Conversant  ?HEENT Atraumatic; Op clear with mmm.  Normo-cephalic. Pupils reactive. Anicteric sclerae  ?Thyroid/Neck Smooth without nodularity or enlargement. Normal ROM.  Neck Supple.  ?Skin No rashes, lesions or ulceration. Normal palpated skin turgor. No nodularity.  ?Breasts: No masses or discharge.  Symmetric.  No axillary adenopathy.  ?Lungs: Clear to auscultation.No rales or wheezes. Normal Respiratory effort, no retractions.  ?Heart: NSR.  No murmurs or rubs appreciated. No periferal edema  ?Abdomen: Soft.  Non-tender.  No masses.  No HSM. No hernia  ?Extremities: Moves all appropriately.  Normal ROM for age. No lymphadenopathy.  ?Neuro: Oriented to PPT.  Normal mood. Normal affect.  ? ?  Pelvic:   ?Vulva: Normal appearance.  No lesions.  ?Vagina: No lesions or abnormalities noted.  ?Support: Normal pelvic support.  ?Urethra No masses tenderness or scarring.  ?Meatus Normal size without lesions or prolapse.  ?Cervix: Normal ectropion.  No lesions.  ?Anus: Normal exam.  No lesions.  ?Perineum: Normal exam.  No lesions.  ?      Bimanual   ?Uterus: Normal size.  Non-tender.  Mobile.  AV.  ?Adnexae: No masses.  Non-tender to palpation.  ?Cul-de-sac: Negative for abnormality.  ? ?Assessment:  ? ?[id number] ?Patient Active Problem List  ? Diagnosis Date Noted  ? Opioid use disorder 09/09/2021  ? Pregnancy complicated by subutex maintenance, antepartum ([place]) 09/09/2021  ? Labor and delivery, indication for care 07/23/2021  ? Abnormal uterine bleeding 03/12/2019  ? Suboxone maintenance treatment complicating pregnancy, antepartum ([place]) 09/13/2016  ? Anxiety 07/03/2014  ? Depression 07/03/2014  ? ? ?1. Pre-op exam   ? ? She has reaffirmed her desire for permanent sterilization.  Other forms of birth control including long-acting methods were discussed in detail the patient is not interested. ? ?Plan:   ? ?Orders: ?No orders of the defined types were placed in this encounter. ?  ? ?1.  Laparoscopic permanent sterilization using Filshie clips ? ?Pre-op discussions regarding Risks and Benefits of her scheduled surgery. ? ?Tubal ?We have discussed permanent sterilization in detail.  I have reviewed Filshie clip placement as a means of sterilization.  I have explained the risks and benefits of this procedure in detail.  I have stressed the fact that this is a permanent and not reversible procedure and there is a failure rate of 3 to7 per 1000 which is somewhat timing and method dependent.  I have discussed the possibility of inadvertent damage to bowel, bladder, blood vessels or other internal organs..  I have specifically discussed the risk of anesthesia, infection and blood loss with her.  We have discussed the possibility of laparotomy should there be a problem and the additional possibility of a longer hospital stay.  I have spoken with her regarding the recovery  period, informing her that after this procedure, most people are performing their normal daily activities within one week.  I have recommended abstinence or another birth control method for 2-4 weeks following this procedure.  Other options of birth control have also been discussed.  The procedure of sterilization and alternate methods of birth control were discussed in detail with the patient.  I have answered all her questions and I believe that she has an adequate and informed understanding of the procedure. ? ?I spent 31 minutes involved in the care of this patient preparing to see the patient by obtaining and reviewing her medical history (including labs, imaging tests and prior procedures), documenting clinical information in the electronic health record (EHR), counseling and coordinating care plans, writing and sending prescriptions, ordering tests or procedures and in direct communicating with the patient and medical staff discussing pertinent  items from her history and physical exam. ? ? ?Finis Bud, M.D. ?09/29/2021 ?9:02 AM ? ?

## 2021-10-07 ENCOUNTER — Other Ambulatory Visit
Admission: RE | Admit: 2021-10-07 | Discharge: 2021-10-07 | Disposition: A | Discharge status: Home / Self Care | Payer: Medicaid Other | Source: Ambulatory Visit | Attending: Obstetrics and Gynecology | Admitting: Obstetrics and Gynecology

## 2021-10-07 ENCOUNTER — Other Ambulatory Visit: Payer: Self-pay

## 2021-10-07 DIAGNOSIS — Z01812 Encounter for preprocedural laboratory examination: Secondary | ICD-10-CM

## 2021-10-07 HISTORY — DX: Depression, unspecified: F32.A

## 2021-10-07 HISTORY — DX: Anxiety disorder, unspecified: F41.9

## 2021-10-07 HISTORY — DX: Other psychoactive substance abuse, uncomplicated: F19.10

## 2021-10-07 NOTE — Patient Instructions (Addendum)
Your procedure is scheduled on: 10/10/21 - Monday ?Report to the Registration Desk on the 1st floor of the [facility]. ?To find out your arrival time, please call [phone] between 1PM - 3PM on: 10/07/21 - Friday ?If your arrival time is 6:00 am, do not arrive prior to that time as the [facility] entrance doors do not open until 6:00 am. ? ?REMEMBER: ?Instructions that are not followed completely may result in serious medical risk, up to and including death; or upon the discretion of your surgeon and anesthesiologist your surgery may need to be rescheduled. ? ?Do not eat food or drink any fluids after midnight the night before surgery.  ?No gum chewing, lozengers or hard candies. ? ?TAKE THESE MEDICATIONS THE MORNING OF SURGERY WITH A SIP OF WATER: ? ?- FLUoxetine (PROZAC) 20 MG capsule ? ?One week prior to surgery: ?Stop Anti-inflammatories (NSAIDS) such as Advil, Aleve, Ibuprofen, Motrin, Naproxen, Naprosyn and Aspirin based products such as Excedrin, Goodys Powder, BC Powder. ? ?Stop ANY OVER THE COUNTER supplements until after surgery. ? ?You may take Tylenol if needed for pain up until the day of surgery. ? ?No Alcohol for 24 hours before or after surgery. ? ?No Smoking including e-cigarettes for 24 hours prior to surgery.  ?No chewable tobacco products for at least 6 hours prior to surgery.  ?No nicotine patches on the day of surgery. ? ?Do not use any "recreational" drugs for at least a week prior to your surgery.  ?Please be advised that the combination of cocaine and anesthesia may have negative outcomes, up to and including death. ?If you test positive for cocaine, your surgery will be cancelled. ? ?On the morning of surgery brush your teeth with toothpaste and water, you may rinse your mouth with mouthwash if you wish. ?Do not swallow any toothpaste or mouthwash. ? ?Do not wear jewelry, make-up, hairpins, clips or nail polish. ? ?Do not wear lotions, powders, or perfumes.  ? ?Do not shave body  from the neck down 48 hours prior to surgery just in case you cut yourself which could leave a site for infection.  ?Also, freshly shaved skin may become irritated if using the CHG soap. ? ?Contact lenses, hearing aids and dentures may not be worn into surgery. ? ?Do not bring valuables to the hospital. [facility] is not responsible for any missing/lost belongings or valuables.  ? ?Notify your doctor if there is any change in your medical condition (cold, fever, infection). ? ?Wear comfortable clothing (specific to your surgery type) to the hospital. ? ?After surgery, you can help prevent lung complications by doing breathing exercises.  ?Take deep breaths and cough every 1-2 hours. Your doctor may order a device called an Incentive Spirometer to help you take deep breaths. ?When coughing or sneezing, hold a pillow firmly against your incision with both hands. This is called ?splinting.? Doing this helps protect your incision. It also decreases belly discomfort. ? ?If you are being admitted to the hospital overnight, leave your suitcase in the car. ?After surgery it may be brought to your room. ? ?If you are being discharged the day of surgery, you will not be allowed to drive home. ?You will need a responsible adult (18 years or older) to drive you home and stay with you that night.  ? ?If you are taking public transportation, you will need to have a responsible adult (18 years or older) with you. ?Please confirm with your physician that it is acceptable to  use public transportation.  ? ?Please call the Pre-admissions Testing Dept. at [phone] if you have any questions about these instructions. ? ?Surgery Visitation Policy: ? ?Patients undergoing a surgery or procedure may have two family members or support persons with them as long as the person is not COVID-19 positive or experiencing its symptoms.  ? ?Inpatient Visitation:   ? ?Visiting hours are 7 a.m. to 8 p.m. ?Up to four visitors are allowed at one  time in a patient room, including children. The visitors may rotate out with other people during the day. One designated support person (adult) may remain overnight.  ?

## 2021-10-08 NOTE — Addendum Note (Signed)
Addendum  created 10/08/21 1327 by Foye Deer, MD  ? Clinical Note Signed  ?  ?

## 2021-10-10 ENCOUNTER — Other Ambulatory Visit: Payer: Self-pay

## 2021-10-10 ENCOUNTER — Encounter: Payer: Self-pay | Admitting: Obstetrics and Gynecology

## 2021-10-10 ENCOUNTER — Ambulatory Visit: Payer: Medicaid Other | Admitting: Registered Nurse

## 2021-10-10 ENCOUNTER — Ambulatory Visit
Admission: RE | Admit: 2021-10-10 | Discharge: 2021-10-10 | Disposition: A | Discharge status: Home / Self Care | Payer: Medicaid Other | Attending: Obstetrics and Gynecology | Admitting: Obstetrics and Gynecology

## 2021-10-10 ENCOUNTER — Encounter
Admission: RE | Disposition: A | Discharge status: Home / Self Care | Payer: Self-pay | Source: Home / Self Care | Attending: Obstetrics and Gynecology

## 2021-10-10 DIAGNOSIS — F1190 Opioid use, unspecified, uncomplicated: Secondary | ICD-10-CM | POA: Insufficient documentation

## 2021-10-10 DIAGNOSIS — Z01818 Encounter for other preprocedural examination: Secondary | ICD-10-CM

## 2021-10-10 DIAGNOSIS — Z302 Encounter for sterilization: Secondary | ICD-10-CM

## 2021-10-10 DIAGNOSIS — Z01812 Encounter for preprocedural laboratory examination: Secondary | ICD-10-CM

## 2021-10-10 DIAGNOSIS — F419 Anxiety disorder, unspecified: Secondary | ICD-10-CM | POA: Diagnosis not present

## 2021-10-10 HISTORY — PX: LAPAROSCOPIC TUBAL LIGATION: SHX1937

## 2021-10-10 LAB — URINE DRUG SCREEN, QUALITATIVE (ARMC ONLY)
Amphetamines, Ur Screen: POSITIVE — AB
Barbiturates, Ur Screen: NOT DETECTED
Benzodiazepine, Ur Scrn: NOT DETECTED
Cannabinoid 50 Ng, Ur ~~LOC~~: POSITIVE — AB
Cocaine Metabolite,Ur ~~LOC~~: NOT DETECTED
MDMA (Ecstasy)Ur Screen: NOT DETECTED
Methadone Scn, Ur: NOT DETECTED
Opiate, Ur Screen: NOT DETECTED
Phencyclidine (PCP) Ur S: NOT DETECTED
Tricyclic, Ur Screen: NOT DETECTED

## 2021-10-10 LAB — TYPE AND SCREEN
ABO/RH(D): AB POS
Antibody Screen: NEGATIVE

## 2021-10-10 LAB — POCT PREGNANCY, URINE: Preg Test, Ur: NEGATIVE

## 2021-10-10 SURGERY — LIGATION, FALLOPIAN TUBE, LAPAROSCOPIC
Anesthesia: General | Laterality: Bilateral

## 2021-10-10 MED ORDER — ROCURONIUM BROMIDE 100 MG/10ML IV SOLN
INTRAVENOUS | Status: DC | PRN
  Administered 2021-10-10: 60 mg via INTRAVENOUS

## 2021-10-10 MED ORDER — BUPIVACAINE HCL (PF) 0.5 % IJ SOLN
INTRAMUSCULAR | Status: AC
  Filled 2021-10-10: qty 30

## 2021-10-10 MED ORDER — CELECOXIB 200 MG PO CAPS
ORAL_CAPSULE | ORAL | Status: AC
  Administered 2021-10-10: 200 mg via ORAL
  Filled 2021-10-10: qty 1

## 2021-10-10 MED ORDER — CHLORHEXIDINE GLUCONATE 0.12 % MT SOLN
OROMUCOSAL | Status: AC
  Administered 2021-10-10: 15 mL via OROMUCOSAL
  Filled 2021-10-10: qty 15

## 2021-10-10 MED ORDER — CELECOXIB 200 MG PO CAPS: 200.0000 mg | ORAL_CAPSULE | Freq: Once | ORAL | Status: AC

## 2021-10-10 MED ORDER — HYDROMORPHONE HCL 1 MG/ML IJ SOLN
INTRAMUSCULAR | Status: AC
  Administered 2021-10-10: 0.5 mg via INTRAVENOUS
  Filled 2021-10-10: qty 1

## 2021-10-10 MED ORDER — LIDOCAINE HCL (CARDIAC) PF 100 MG/5ML IV SOSY
PREFILLED_SYRINGE | INTRAVENOUS | Status: DC | PRN
  Administered 2021-10-10: 100 mg via INTRAVENOUS

## 2021-10-10 MED ORDER — HYDROMORPHONE HCL 1 MG/ML IJ SOLN
0.5000 mg | INTRAMUSCULAR | Status: AC | PRN
  Administered 2021-10-10 (×2): 0.5 mg via INTRAVENOUS

## 2021-10-10 MED ORDER — DROPERIDOL 2.5 MG/ML IJ SOLN: 0.6250 mg | Freq: Once | INTRAMUSCULAR | Status: DC | PRN

## 2021-10-10 MED ORDER — HYDROMORPHONE HCL 1 MG/ML IJ SOLN
0.2500 mg | INTRAMUSCULAR | Status: DC | PRN
  Administered 2021-10-10 (×2): 0.25 mg via INTRAVENOUS
  Administered 2021-10-10: 0.5 mg via INTRAVENOUS
  Administered 2021-10-10: 0.25 mg via INTRAVENOUS

## 2021-10-10 MED ORDER — POVIDONE-IODINE 10 % EX SWAB
2.0000 "application " | Freq: Once | CUTANEOUS | Status: AC
  Administered 2021-10-10: 2 via TOPICAL

## 2021-10-10 MED ORDER — OXYCODONE HCL 5 MG PO TABS
10.0000 mg | ORAL_TABLET | Freq: Once | ORAL | Status: AC
  Administered 2021-10-10: 10 mg via ORAL

## 2021-10-10 MED ORDER — GABAPENTIN 300 MG PO CAPS: 300.0000 mg | ORAL_CAPSULE | Freq: Once | ORAL | Status: AC

## 2021-10-10 MED ORDER — KETOROLAC TROMETHAMINE 30 MG/ML IJ SOLN
INTRAMUSCULAR | Status: AC
  Filled 2021-10-10: qty 1

## 2021-10-10 MED ORDER — OXYCODONE HCL 5 MG PO TABS
ORAL_TABLET | ORAL | Status: AC
  Filled 2021-10-10: qty 2

## 2021-10-10 MED ORDER — ACETAMINOPHEN 10 MG/ML IV SOLN: 1000.0000 mg | Freq: Once | INTRAVENOUS | Status: DC | PRN

## 2021-10-10 MED ORDER — ACETAMINOPHEN 500 MG PO TABS
1000.0000 mg | ORAL_TABLET | Freq: Once | ORAL | Status: AC
Start: 2021-10-10 — End: 2021-10-10

## 2021-10-10 MED ORDER — FAMOTIDINE 20 MG PO TABS: 20.0000 mg | ORAL_TABLET | Freq: Once | ORAL | Status: AC

## 2021-10-10 MED ORDER — MIDAZOLAM HCL 2 MG/2ML IJ SOLN
INTRAMUSCULAR | Status: DC | PRN
  Administered 2021-10-10: 2 mg via INTRAVENOUS

## 2021-10-10 MED ORDER — MIDAZOLAM HCL 2 MG/2ML IJ SOLN
INTRAMUSCULAR | Status: AC
  Filled 2021-10-10: qty 2

## 2021-10-10 MED ORDER — BUPIVACAINE HCL (PF) 0.5 % IJ SOLN
INTRAMUSCULAR | Status: DC | PRN
  Administered 2021-10-10: 10 mL

## 2021-10-10 MED ORDER — PROPOFOL 10 MG/ML IV BOLUS
INTRAVENOUS | Status: AC
  Filled 2021-10-10: qty 40

## 2021-10-10 MED ORDER — ONDANSETRON HCL 4 MG/2ML IJ SOLN
INTRAMUSCULAR | Status: AC
  Filled 2021-10-10: qty 2

## 2021-10-10 MED ORDER — IBUPROFEN 800 MG PO TABS: 800.0000 mg | ORAL_TABLET | Freq: Three times a day (TID) | ORAL | 0 refills | Status: AC | PRN

## 2021-10-10 MED ORDER — CHLORHEXIDINE GLUCONATE 0.12 % MT SOLN: 15.0000 mL | Freq: Once | OROMUCOSAL | Status: AC

## 2021-10-10 MED ORDER — GABAPENTIN 600 MG PO TABS
300.0000 mg | ORAL_TABLET | Freq: Once | ORAL | Status: DC
Start: 2021-10-10 — End: 2021-10-10
  Filled 2021-10-10: qty 0.5

## 2021-10-10 MED ORDER — KETOROLAC TROMETHAMINE 30 MG/ML IJ SOLN
30.0000 mg | Freq: Once | INTRAMUSCULAR | Status: AC
  Administered 2021-10-10: 30 mg via INTRAVENOUS

## 2021-10-10 MED ORDER — FAMOTIDINE 20 MG PO TABS
ORAL_TABLET | ORAL | Status: AC
  Administered 2021-10-10: 20 mg via ORAL
  Filled 2021-10-10: qty 1

## 2021-10-10 MED ORDER — SUGAMMADEX SODIUM 200 MG/2ML IV SOLN
INTRAVENOUS | Status: DC | PRN
  Administered 2021-10-10: 200 mg via INTRAVENOUS

## 2021-10-10 MED ORDER — GABAPENTIN 300 MG PO CAPS
ORAL_CAPSULE | ORAL | Status: AC
  Administered 2021-10-10: 300 mg via ORAL
  Filled 2021-10-10: qty 1

## 2021-10-10 MED ORDER — PROMETHAZINE HCL 25 MG/ML IJ SOLN: 6.2500 mg | INTRAMUSCULAR | Status: DC | PRN

## 2021-10-10 MED ORDER — DEXAMETHASONE SODIUM PHOSPHATE 10 MG/ML IJ SOLN
INTRAMUSCULAR | Status: DC | PRN
  Administered 2021-10-10: 10 mg via INTRAVENOUS

## 2021-10-10 MED ORDER — PROPOFOL 10 MG/ML IV BOLUS
INTRAVENOUS | Status: DC | PRN
  Administered 2021-10-10: 200 mg via INTRAVENOUS

## 2021-10-10 MED ORDER — HYDROMORPHONE HCL 1 MG/ML IJ SOLN
INTRAMUSCULAR | Status: DC | PRN
  Administered 2021-10-10: 1 mg via INTRAVENOUS

## 2021-10-10 MED ORDER — ONDANSETRON HCL 4 MG/2ML IJ SOLN
INTRAMUSCULAR | Status: DC | PRN
  Administered 2021-10-10: 4 mg via INTRAVENOUS

## 2021-10-10 MED ORDER — ORAL CARE MOUTH RINSE: 15.0000 mL | Freq: Once | OROMUCOSAL | Status: AC

## 2021-10-10 MED ORDER — 0.9 % SODIUM CHLORIDE (POUR BTL) OPTIME
TOPICAL | Status: DC | PRN
Start: 2021-10-10 — End: 2021-10-10
  Administered 2021-10-10: 1000 mL

## 2021-10-10 MED ORDER — LACTATED RINGERS IV SOLN: INTRAVENOUS | Status: DC

## 2021-10-10 MED ORDER — HYDROMORPHONE HCL 1 MG/ML IJ SOLN
INTRAMUSCULAR | Status: AC
  Filled 2021-10-10: qty 1

## 2021-10-10 MED ORDER — ACETAMINOPHEN 500 MG PO TABS
ORAL_TABLET | ORAL | Status: AC
  Administered 2021-10-10: 1000 mg via ORAL
  Filled 2021-10-10: qty 2

## 2021-10-10 MED ORDER — LIDOCAINE HCL (PF) 2 % IJ SOLN
INTRAMUSCULAR | Status: AC
  Filled 2021-10-10: qty 5

## 2021-10-10 MED ORDER — DEXAMETHASONE SODIUM PHOSPHATE 10 MG/ML IJ SOLN
INTRAMUSCULAR | Status: AC
  Filled 2021-10-10: qty 1

## 2021-10-10 MED ORDER — GLYCOPYRROLATE 0.2 MG/ML IJ SOLN
INTRAMUSCULAR | Status: DC | PRN
  Administered 2021-10-10: .2 mg via INTRAVENOUS

## 2021-10-10 MED ORDER — HYDROMORPHONE HCL 1 MG/ML IJ SOLN
INTRAMUSCULAR | Status: AC
  Administered 2021-10-10: 0.25 mg via INTRAVENOUS
  Filled 2021-10-10: qty 1

## 2021-10-10 SURGICAL SUPPLY — 24 items
BLADE SURG SZ11 CARB STEEL (BLADE) ×2 IMPLANT
CATH ROBINSON RED A/P 16FR (CATHETERS) ×1 IMPLANT
CATH URET ROBINSON 16FR STRL (CATHETERS) ×1 IMPLANT
CLIP FILSHIE TUBAL LIGA STRL (Clip) ×2 IMPLANT
DERMABOND ADVANCED (GAUZE/BANDAGES/DRESSINGS) ×1
DERMABOND ADVANCED .7 DNX12 (GAUZE/BANDAGES/DRESSINGS) ×1 IMPLANT
GLOVE BIOGEL PI ORTHO PRO 7.5 (GLOVE) ×2
GLOVE PI ORTHO PRO STRL 7.5 (GLOVE) ×2 IMPLANT
GOWN STRL REUS W/ TWL LRG LVL3 (GOWN DISPOSABLE) ×1 IMPLANT
GOWN STRL REUS W/ TWL XL LVL3 (GOWN DISPOSABLE) ×1 IMPLANT
GOWN STRL REUS W/TWL LRG LVL3 (GOWN DISPOSABLE) ×1
GOWN STRL REUS W/TWL XL LVL3 (GOWN DISPOSABLE) ×2
KIT TURNOVER CYSTO (KITS) ×2 IMPLANT
MANIFOLD NEPTUNE II (INSTRUMENTS) ×2 IMPLANT
PACK GYN LAPAROSCOPIC (MISCELLANEOUS) ×2 IMPLANT
PAD OB MATERNITY 4.3X12.25 (PERSONAL CARE ITEMS) ×2 IMPLANT
PAD PREP 24X41 OB/GYN DISP (PERSONAL CARE ITEMS) ×2 IMPLANT
SET TUBE SMOKE EVAC HIGH FLOW (TUBING) ×2 IMPLANT
SOL PREP PVP 2OZ (MISCELLANEOUS) ×2
SOLUTION PREP PVP 2OZ (MISCELLANEOUS) ×1 IMPLANT
SUT VIC AB 4-0 FS2 27 (SUTURE) ×2 IMPLANT
SUT VICRYL 0 AB UR-6 (SUTURE) ×2 IMPLANT
TROCAR XCEL 12X100 BLDLESS (ENDOMECHANICALS) ×2 IMPLANT
WATER STERILE IRR 500ML POUR (IV SOLUTION) ×2 IMPLANT

## 2021-10-10 NOTE — Progress Notes (Signed)
Pt pain is 7/10 after 2 mg of Dilaudid. Dr. Bertell Maria notified. Acknowledged. Orders received. See MAR ?

## 2021-10-10 NOTE — Anesthesia Postprocedure Evaluation (Signed)
Anesthesia Post Note ? ?Patient: Amanda Barnett ? ?Procedure(s) Performed: LAPAROSCOPIC TUBAL LIGATION (Bilateral) ? ?Patient location during evaluation: PACU ?Anesthesia Type: General ?Level of consciousness: awake and alert ?Pain management: pain level controlled ?Vital Signs Assessment: post-procedure vital signs reviewed and stable ?Respiratory status: spontaneous breathing, nonlabored ventilation, respiratory function stable and patient connected to nasal cannula oxygen ?Cardiovascular status: blood pressure returned to baseline and stable ?Postop Assessment: no apparent nausea or vomiting ?Anesthetic complications: no ? ? ?No notable events documented. ? ? ?Last Vitals:  ?Vitals:  ? 10/10/21 1824 10/10/21 1830  ?BP:    ?Pulse: 69 70  ?Resp: 14 (!) 22  ?Temp:    ?SpO2: 100% 99%  ?  ?Last Pain:  ?Vitals:  ? 10/10/21 1830  ?TempSrc:   ?PainSc: 7   ? ? ?  ?  ?  ?  ?  ?  ? ?Corinda Gubler ? ? ? ? ?

## 2021-10-10 NOTE — Addendum Note (Signed)
Addendum  created 10/10/21 1325 by Stormy Fabian, CRNA  ? Attached Procedures edited  ?  ?

## 2021-10-10 NOTE — Discharge Instructions (Signed)
AMBULATORY SURGERY  ?DISCHARGE INSTRUCTIONS ? ? ?The drugs that you were given will stay in your system until tomorrow so for the next 24 hours you should not: ? ?Drive an automobile ?Make any legal decisions ?Drink any alcoholic beverage ? ? ?You may resume regular meals tomorrow.  Today it is better to start with liquids and gradually work up to solid foods. ? ?You may eat anything you prefer, but it is better to start with liquids, then soup and crackers, and gradually work up to solid foods. ? ? ?Please notify your doctor immediately if you have any unusual bleeding, trouble breathing, redness and pain at the surgery site, drainage, fever, or pain not relieved by medication. ? ? ? ?Additional Instructions: ? ? ? ?Please contact your physician with any problems or Same Day Surgery at [phone], Monday through Friday 6 am to 4 pm, or [place] [facility] number at [phone].  ?

## 2021-10-10 NOTE — Transfer of Care (Signed)
Immediate Anesthesia Transfer of Care Note ? ?Patient: Amanda Barnett ? ?Procedure(s) Performed: LAPAROSCOPIC TUBAL LIGATION (Bilateral) ? ?Patient Location: PACU ? ?Anesthesia Type:General ? ?Level of Consciousness: drowsy ? ?Airway & Oxygen Therapy: Patient Spontanous Breathing and Patient connected to face mask oxygen ? ?Post-op Assessment: Report given to RN and Post -op Vital signs reviewed and stable ? ?Post vital signs: Reviewed and stable ? ?Last Vitals:  ?Vitals Value Taken Time  ?BP    ?Temp    ?Pulse    ?Resp    ?SpO2    ? ? ?Last Pain:  ?Vitals:  ? 10/10/21 1201  ?TempSrc: Temporal  ?PainSc: 0-No pain  ?   ? ?Patients Stated Pain Goal: 0 (10/10/21 1201) ? ?Complications: No notable events documented. ?

## 2021-10-10 NOTE — Interval H&P Note (Signed)
History and Physical Interval Note: ? ?10/10/2021 ?4:15 PM ? ?Amanda Barnett  has presented today for surgery, with the diagnosis of desires permanent sterilization.  The various methods of treatment have been discussed with the patient and family. After consideration of risks, benefits and other options for treatment, the patient has consented to  Procedure(s): ?LAPAROSCOPIC TUBAL LIGATION (Bilateral) as a surgical intervention.  The patient's history has been reviewed, patient examined, no change in status, stable for surgery.  I have reviewed the patient's chart and labs.  Questions were answered to the patient's satisfaction.   ? ? ?Brennan Bailey ? ? ?

## 2021-10-10 NOTE — Anesthesia Preprocedure Evaluation (Addendum)
Anesthesia Evaluation  ?Patient identified by MRN, date of birth, ID band ?Patient awake ? ? ? ?Reviewed: ?H&P , NPO status , Patient's Chart, lab work & pertinent test results ? ?History of Anesthesia Complications ?Negative for: history of anesthetic complications ? ?Airway ?Mallampati: II ? ?TM Distance: <3 FB ?Neck ROM: full ? ? ? Dental ?no notable dental hx. ? ?  ?Pulmonary ?neg shortness of breath, asthma (as a child) , neg sleep apnea, neg recent URI, Current SmokerPatient did not abstain from smoking.,  ?  ?Pulmonary exam normal ? ? ? ? ? ? ? Cardiovascular ?negative cardio ROS ?Normal cardiovascular exam ? ? ?  ?Neuro/Psych ?PSYCHIATRIC DISORDERS Anxiety negative neurological ROS ?   ? GI/Hepatic ?Neg liver ROS, GERD  Controlled,  ?Endo/Other  ?negative endocrine ROS ? Renal/GU ?negative Renal ROS  ?negative genitourinary ?  ?Musculoskeletal ? ? Abdominal ?(+) + obese,   ?Peds ? Hematology ?negative hematology ROS ?(+)   ?Anesthesia Other Findings ?SUBOXONE USE due to Opioid use disorder ?Recent cocaine use and adderall use on Friday. UDS + for amphetamines and opioids. ? ?Past Medical History: ?No date: Abdominal pain, recurrent ?No date: Amenorrhea ?No date: Asthma ?    Comment:  as a child ?No date: GERD (gastroesophageal reflux disease) ?No date: Infantile colic ?    Comment:  resolved ? ? Reproductive/Obstetrics ?(+) Pregnancy ? ?  ? ? ? ? ? ? ? ? ? ? ? ? ? ?  ?  ? ? ? ? ? ? ?Anesthesia Physical ? ?Anesthesia Plan ? ?ASA: 2 ? ?Anesthesia Plan: General  ? ?Post-op Pain Management: Tylenol PO (pre-op)*, Celebrex PO (pre-op)* and Gabapentin PO (pre-op)*  ? ?Induction: Intravenous ? ?PONV Risk Score and Plan: 3 and Ondansetron, Dexamethasone and Midazolam ? ?Airway Management Planned: Oral ETT ? ?Additional Equipment:  ? ?Intra-op Plan:  ? ?Post-operative Plan:  ? ?Informed Consent: I have reviewed the patients History and Physical, chart, labs and discussed the procedure  including the risks, benefits and alternatives for the proposed anesthesia with the patient or authorized representative who has indicated his/her understanding and acceptance.  ? ? ? ?Dental advisory given ? ?Plan Discussed with: Anesthesiologist and CRNA ? ?Anesthesia Plan Comments:   ? ? ? ? ? ?Anesthesia Quick Evaluation ? ?

## 2021-10-10 NOTE — Anesthesia Procedure Notes (Signed)
Procedure Name: Intubation ?Date/Time: 10/10/2021 4:21 PM ?Performed by: Esaw Grandchild, CRNA ?Pre-anesthesia Checklist: Patient identified, Emergency Drugs available, Suction available and Patient being monitored ?Patient Re-evaluated:Patient Re-evaluated prior to induction ?Oxygen Delivery Method: Circle system utilized ?Preoxygenation: Pre-oxygenation with 100% oxygen ?Induction Type: IV induction ?Ventilation: Mask ventilation without difficulty ?Laryngoscope Size: Sabra Heck and 2 ?Grade View: Grade I ?Tube type: Oral ?Tube size: 6.5 mm ?Number of attempts: 1 ?Airway Equipment and Method: Stylet, Oral airway, LTA kit utilized and Bite block ?Placement Confirmation: ETT inserted through vocal cords under direct vision, positive ETCO2 and breath sounds checked- equal and bilateral ?Secured at: 21 cm ?Tube secured with: Tape ?Dental Injury: Teeth and Oropharynx as per pre-operative assessment  ? ? ? ? ?

## 2021-10-10 NOTE — Op Note (Signed)
? ? ?    OPERATIVE NOTE ?10/10/2021 ?5:45 PM ? ?PRE-OPERATIVE DIAGNOSIS:  ?1) desires permanent sterilization ? ?POST-OPERATIVE DIAGNOSIS:  ?* No Diagnosis Codes entered * ? ?OPERATION:  Laparoscopic Filshie clip tubal occlusion for sterilization ? ?SURGEON(S): Surgeon(s) and Role: ?   Harlin Heys, MD - Primary  ? ?ANESTHESIA: General ? ?ESTIMATED BLOOD LOSS: 1 mL ? ?OPERATIVE FINDINGS: Retroverted uterus ? ?SPECIMEN: * No specimens in log * ? ?COMPLICATIONS: None ? ?DISPOSITION: Stable to recovery room ? ?DESCRIPTION OF PROCEDURE: ?     The patient was prepped and draped in the dorsolithotomy position and placed under general anesthesia. The bladder was emptied. The cervix was grasped with a multi-toothed tenaculum and a uterine manipulator was placed within the cervical os respecting the position and curvature of the uterus. ?After changing gloves we proceeded abdominally. A small infraumbilical incision was made and a 10 mm trocar port was placed within the abdominopelvic cavity. The opening pressure was less than 7 mmHg.  Approximately 3 and 1/2 L of carbon dioxide gas was instilled within the abdominal pelvic cavity. The laparoscope was placed and the pelvis and abdomen were carefully inspected. ?The right fallopian tube was identified and followed out to its fine fimbriated end and then back approximate half the way. At this point the section of tube was elevated on Filshie clip and completely occluded in a perpendicular manner. The left fallopian tube was similarly identified and again the midportion of the tube is completely occluded using a Filshie clip in a perpendicular manner. ?Hemostasis of the fallopian tubes as well as the pelvis was noted. The laparoscope was removed the trocar sleeve was removed and the incision was closed with a deep suture through the fascia of 0 Vicryl followed by subcuticular closure of the skin. A long-acting anesthetic was injected.  Steri-Strips were applied. The  uterine manipulator was removed. Hemostasis of the cervix was noted. The patient went to the recovery room in stable condition. ? ? ?Finis Bud, M.D. ?10/10/2021 ?5:45 PM ? ?

## 2021-10-11 ENCOUNTER — Encounter: Payer: Self-pay | Admitting: Obstetrics and Gynecology
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
## 2021-10-21 ENCOUNTER — Encounter: Payer: Medicaid Other | Admitting: Obstetrics

## 2022-03-06 ENCOUNTER — Encounter: Payer: Self-pay | Admitting: Obstetrics

## 2024-03-03 ENCOUNTER — Emergency Department

## 2024-03-03 ENCOUNTER — Emergency Department: Admission: EM | Admit: 2024-03-03 | Discharge: 2024-03-04 | Disposition: A | Discharge status: Home / Self Care

## 2024-03-03 ENCOUNTER — Other Ambulatory Visit: Payer: Self-pay

## 2024-03-03 DIAGNOSIS — S0012XA Contusion of left eyelid and periocular area, initial encounter: Secondary | ICD-10-CM | POA: Diagnosis not present

## 2024-03-03 DIAGNOSIS — S1093XA Contusion of unspecified part of neck, initial encounter: Secondary | ICD-10-CM | POA: Diagnosis not present

## 2024-03-03 DIAGNOSIS — S20211A Contusion of right front wall of thorax, initial encounter: Secondary | ICD-10-CM | POA: Insufficient documentation

## 2024-03-03 DIAGNOSIS — R519 Headache, unspecified: Secondary | ICD-10-CM | POA: Insufficient documentation

## 2024-03-03 DIAGNOSIS — H1132 Conjunctival hemorrhage, left eye: Secondary | ICD-10-CM | POA: Insufficient documentation

## 2024-03-03 DIAGNOSIS — Z0000 Encounter for general adult medical examination without abnormal findings: Secondary | ICD-10-CM

## 2024-03-03 DIAGNOSIS — S0011XA Contusion of right eyelid and periocular area, initial encounter: Secondary | ICD-10-CM | POA: Insufficient documentation

## 2024-03-03 DIAGNOSIS — S0591XA Unspecified injury of right eye and orbit, initial encounter: Secondary | ICD-10-CM | POA: Diagnosis present

## 2024-03-03 LAB — COMPREHENSIVE METABOLIC PANEL WITH GFR
ALT: 22 U/L (ref 0–44)
AST: 26 U/L (ref 15–41)
Albumin: 3.9 g/dL (ref 3.5–5.0)
Alkaline Phosphatase: 53 U/L (ref 38–126)
Anion gap: 9 (ref 5–15)
BUN: 7 mg/dL (ref 6–20)
CO2: 26 mmol/L (ref 22–32)
Calcium: 8.8 mg/dL — ABNORMAL LOW (ref 8.9–10.3)
Chloride: 103 mmol/L (ref 98–111)
Creatinine, Ser: 0.73 mg/dL (ref 0.44–1.00)
GFR, Estimated: >60 mL/min (ref >60)
Glucose, Bld: 97 mg/dL (ref 70–99)
Potassium: 3.8 mmol/L (ref 3.5–5.1)
Sodium: 138 mmol/L (ref 135–145)
Total Bilirubin: 0.4 mg/dL (ref 0.0–1.2)
Total Protein: 7.5 g/dL (ref 6.5–8.1)

## 2024-03-03 LAB — ACETAMINOPHEN LEVEL: Acetaminophen (Tylenol), Serum: <10 ug/mL — ABNORMAL LOW (ref 10–30)

## 2024-03-03 LAB — ETHANOL: Alcohol, Ethyl (B): <15 mg/dL (ref <15)

## 2024-03-03 LAB — URINE DRUG SCREEN, QUALITATIVE (ARMC ONLY)
Amphetamines, Ur Screen: POSITIVE — AB
Barbiturates, Ur Screen: NOT DETECTED
Benzodiazepine, Ur Scrn: NOT DETECTED
Cannabinoid 50 Ng, Ur ~~LOC~~: POSITIVE — AB
Cocaine Metabolite,Ur ~~LOC~~: NOT DETECTED
MDMA (Ecstasy)Ur Screen: NOT DETECTED
Methadone Scn, Ur: NOT DETECTED
Opiate, Ur Screen: NOT DETECTED
Phencyclidine (PCP) Ur S: NOT DETECTED
Tricyclic, Ur Screen: NOT DETECTED

## 2024-03-03 LAB — CBC
HCT: 40.1 % (ref 36.0–46.0)
Hemoglobin: 13.1 g/dL (ref 12.0–15.0)
MCH: 30.5 pg (ref 26.0–34.0)
MCHC: 32.7 g/dL (ref 30.0–36.0)
MCV: 93.5 fL (ref 80.0–100.0)
Platelets: 283 K/uL (ref 150–400)
RBC: 4.29 MIL/uL (ref 3.87–5.11)
RDW: 12.6 % (ref 11.5–15.5)
WBC: 4.4 K/uL (ref 4.0–10.5)
nRBC: 0 % (ref 0.0–0.2)

## 2024-03-03 LAB — POC URINE PREG, ED: Preg Test, Ur: NEGATIVE

## 2024-03-03 LAB — SALICYLATE LEVEL: Salicylate Lvl: <7 mg/dL — ABNORMAL LOW (ref 7.0–30.0)

## 2024-03-03 MED ORDER — ACETAMINOPHEN 500 MG PO TABS
1000.0000 mg | ORAL_TABLET | Freq: Once | ORAL | Status: AC
  Administered 2024-03-03: 1000 mg via ORAL
  Filled 2024-03-03: qty 2

## 2024-03-03 MED ORDER — IOHEXOL 350 MG/ML SOLN
75.0000 mL | Freq: Once | INTRAVENOUS | Status: AC | PRN
  Administered 2024-03-03: 75 mL via INTRAVENOUS

## 2024-03-03 NOTE — ED Notes (Signed)
 Pt given snack and beverage, pt also asked about an update, this tech informed pt's nurse.
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
## 2024-03-03 NOTE — ED Notes (Signed)
 Patient Belongings: Blue pants Black Bra Black tank Top  [employer] Black Underwear Pringles White Socks Water bottle 1 mint 1 airhead Almonds Waffle in plastic bag 2 brown slides
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
